# Patient Record
Sex: Male | Born: 1965 | Race: Black or African American | Hispanic: No | Marital: Married | Smoking: Never smoker
Health system: Southern US, Community
[De-identification: ages and names within clinical notes are randomized; demographics above are authoritative.]

## PROBLEM LIST (undated history)

## (undated) DIAGNOSIS — I10 Essential (primary) hypertension: Secondary | ICD-10-CM

## (undated) DIAGNOSIS — E119 Type 2 diabetes mellitus without complications: Secondary | ICD-10-CM

## (undated) DIAGNOSIS — E785 Hyperlipidemia, unspecified: Secondary | ICD-10-CM

## (undated) HISTORY — DX: Type 2 diabetes mellitus without complications: E11.9

## (undated) HISTORY — DX: Hyperlipidemia, unspecified: E78.5

---

## 2002-04-05 ENCOUNTER — Emergency Department (HOSPITAL_COMMUNITY): Admission: EM | Admit: 2002-04-05 | Discharge: 2002-04-05 | Payer: Self-pay | Admitting: Emergency Medicine

## 2008-06-21 ENCOUNTER — Emergency Department (HOSPITAL_COMMUNITY): Admission: EM | Admit: 2008-06-21 | Discharge: 2008-06-21 | Payer: Self-pay | Admitting: Family Medicine

## 2014-03-05 ENCOUNTER — Ambulatory Visit (INDEPENDENT_AMBULATORY_CARE_PROVIDER_SITE_OTHER): Payer: BC Managed Care – PPO | Admitting: General Surgery

## 2014-03-09 ENCOUNTER — Ambulatory Visit (INDEPENDENT_AMBULATORY_CARE_PROVIDER_SITE_OTHER): Payer: BC Managed Care – PPO | Admitting: Surgery

## 2014-03-17 ENCOUNTER — Ambulatory Visit (INDEPENDENT_AMBULATORY_CARE_PROVIDER_SITE_OTHER): Payer: BC Managed Care – PPO | Admitting: General Surgery

## 2014-04-10 ENCOUNTER — Telehealth (INDEPENDENT_AMBULATORY_CARE_PROVIDER_SITE_OTHER): Payer: Self-pay

## 2014-04-10 NOTE — Telephone Encounter (Signed)
LMOM for pt to call me. I want to go over with the pt about his scheduled appt for 7/14 eval.for gynecomastia. Per Dr Dwain SarnaWakefield if the pt has a breast lump we can see the pt to discuss surgery options but if it's truly just gynecomastia the pt would really need to see plastic surgery for that problem. Dr Dwain SarnaWakefield will not do surgery anymore on gynecomastia unless if the pt has a breast lump. I want the pt to be aware of this information before he shows up on Tuesday.

## 2014-04-13 NOTE — Telephone Encounter (Signed)
Pt was returning call. Pt states that he doesn't feel that this is a breast lump, he feels that it is truly gynecomastia. Informed pt that Dr Dwain SarnaWakefield recommendations were to see a plastic surgeon for this. Pt verbalized understanding and we will cancel his appt to see Dr Dwain SarnaWakefield.

## 2014-04-14 ENCOUNTER — Ambulatory Visit (INDEPENDENT_AMBULATORY_CARE_PROVIDER_SITE_OTHER): Payer: BC Managed Care – PPO | Admitting: General Surgery

## 2015-06-06 ENCOUNTER — Ambulatory Visit (INDEPENDENT_AMBULATORY_CARE_PROVIDER_SITE_OTHER): Payer: Commercial Managed Care - HMO

## 2015-06-06 ENCOUNTER — Ambulatory Visit (INDEPENDENT_AMBULATORY_CARE_PROVIDER_SITE_OTHER): Payer: Commercial Managed Care - HMO | Admitting: Family Medicine

## 2015-06-06 VITALS — BP 156/90 | HR 69 | Temp 98.6°F | Resp 18 | Ht 71.0 in | Wt 237.8 lb

## 2015-06-06 DIAGNOSIS — M5432 Sciatica, left side: Secondary | ICD-10-CM | POA: Diagnosis not present

## 2015-06-06 DIAGNOSIS — R030 Elevated blood-pressure reading, without diagnosis of hypertension: Secondary | ICD-10-CM | POA: Diagnosis not present

## 2015-06-06 DIAGNOSIS — IMO0001 Reserved for inherently not codable concepts without codable children: Secondary | ICD-10-CM

## 2015-06-06 DIAGNOSIS — E119 Type 2 diabetes mellitus without complications: Secondary | ICD-10-CM

## 2015-06-06 MED ORDER — KETOROLAC TROMETHAMINE 60 MG/2ML IM SOLN
60.0000 mg | Freq: Once | INTRAMUSCULAR | Status: AC
  Administered 2015-06-06: 60 mg via INTRAMUSCULAR

## 2015-06-06 MED ORDER — HYDROCODONE-ACETAMINOPHEN 5-325 MG PO TABS: 1.0000 | ORAL_TABLET | Freq: Four times a day (QID) | ORAL | Status: DC | PRN

## 2015-06-06 MED ORDER — PREDNISONE 20 MG PO TABS: ORAL_TABLET | ORAL | Status: DC

## 2015-06-06 MED ORDER — METHOCARBAMOL 500 MG PO TABS: 500.0000 mg | ORAL_TABLET | Freq: Four times a day (QID) | ORAL | Status: DC

## 2015-06-06 NOTE — Patient Instructions (Signed)
Sciatica with Rehab The sciatic nerve runs from the back down the leg and is responsible for sensation and control of the muscles in the back (posterior) side of the thigh, lower leg, and foot. Sciatica is a condition that is characterized by inflammation of this nerve.  SYMPTOMS   Signs of nerve damage, including numbness and/or weakness along the posterior side of the lower extremity.  Pain in the back of the thigh that may also travel down the leg.  Pain that worsens when sitting for long periods of time.  Occasionally, pain in the back or buttock. CAUSES  Inflammation of the sciatic nerve is the cause of sciatica. The inflammation is due to something irritating the nerve. Common sources of irritation include:  Sitting for long periods of time.  Direct trauma to the nerve.  Arthritis of the spine.  Herniated or ruptured disk.  Slipping of the vertebrae (spondylolisthesis).  Pressure from soft tissues, such as muscles or ligament-like tissue (fascia). RISK INCREASES WITH:  Sports that place pressure or stress on the spine (football or weightlifting).  Poor strength and flexibility.  Failure to warm up properly before activity.  Family history of low back pain or disk disorders.  Previous back injury or surgery.  Poor body mechanics, especially when lifting, or poor posture. PREVENTION   Warm up and stretch properly before activity.  Maintain physical fitness:  Strength, flexibility, and endurance.  Cardiovascular fitness.  Learn and use proper technique, especially with posture and lifting. When possible, have coach correct improper technique.  Avoid activities that place stress on the spine. PROGNOSIS If treated properly, then sciatica usually resolves within 6 weeks. However, occasionally surgery is necessary.  RELATED COMPLICATIONS   Permanent nerve damage, including pain, numbness, tingle, or weakness.  Chronic back pain.  Risks of surgery: infection,  bleeding, nerve damage, or damage to surrounding tissues. TREATMENT Treatment initially involves resting from any activities that aggravate your symptoms. The use of ice and medication may help reduce pain and inflammation. The use of strengthening and stretching exercises may help reduce pain with activity. These exercises may be performed at home or with referral to a therapist. A therapist may recommend further treatments, such as transcutaneous electronic nerve stimulation (TENS) or ultrasound. Your caregiver may recommend corticosteroid injections to help reduce inflammation of the sciatic nerve. If symptoms persist despite non-surgical (conservative) treatment, then surgery may be recommended. MEDICATION  If pain medication is necessary, then nonsteroidal anti-inflammatory medications, such as aspirin and ibuprofen, or other minor pain relievers, such as acetaminophen, are often recommended.  Do not take pain medication for 7 days before surgery.  Prescription pain relievers may be given if deemed necessary by your caregiver. Use only as directed and only as much as you need.  Ointments applied to the skin may be helpful.  Corticosteroid injections may be given by your caregiver. These injections should be reserved for the most serious cases, because they may only be given a certain number of times. HEAT AND COLD  Cold treatment (icing) relieves pain and reduces inflammation. Cold treatment should be applied for 10 to 15 minutes every 2 to 3 hours for inflammation and pain and immediately after any activity that aggravates your symptoms. Use ice packs or massage the area with a piece of ice (ice massage).  Heat treatment may be used prior to performing the stretching and strengthening activities prescribed by your caregiver, physical therapist, or athletic trainer. Use a heat pack or soak the injury in warm water.   SEEK MEDICAL CARE IF:  Treatment seems to offer no benefit, or the condition  worsens.  Any medications produce adverse side effects. EXERCISES  RANGE OF MOTION (ROM) AND STRETCHING EXERCISES - Sciatica Most people with sciatic will find that their symptoms worsen with either excessive bending forward (flexion) or arching at the low back (extension). The exercises which will help resolve your symptoms will focus on the opposite motion. Your physician, physical therapist or athletic trainer will help you determine which exercises will be most helpful to resolve your low back pain. Do not complete any exercises without first consulting with your clinician. Discontinue any exercises which worsen your symptoms until you speak to your clinician. If you have pain, numbness or tingling which travels down into your buttocks, leg or foot, the goal of the therapy is for these symptoms to move closer to your back and eventually resolve. Occasionally, these leg symptoms will get better, but your low back pain may worsen; this is typically an indication of progress in your rehabilitation. Be certain to be very alert to any changes in your symptoms and the activities in which you participated in the 24 hours prior to the change. Sharing this information with your clinician will allow him/her to most efficiently treat your condition. These exercises may help you when beginning to rehabilitate your injury. Your symptoms may resolve with or without further involvement from your physician, physical therapist or athletic trainer. While completing these exercises, remember:   Restoring tissue flexibility helps normal motion to return to the joints. This allows healthier, less painful movement and activity.  An effective stretch should be held for at least 30 seconds.  A stretch should never be painful. You should only feel a gentle lengthening or release in the stretched tissue. FLEXION RANGE OF MOTION AND STRETCHING EXERCISES: STRETCH - Flexion, Single Knee to Chest   Lie on a firm bed or floor  with both legs extended in front of you.  Keeping one leg in contact with the floor, bring your opposite knee to your chest. Hold your leg in place by either grabbing behind your thigh or at your knee.  Pull until you feel a gentle stretch in your low back. Hold __________ seconds.  Slowly release your grasp and repeat the exercise with the opposite side. Repeat __________ times. Complete this exercise __________ times per day.  STRETCH - Flexion, Double Knee to Chest  Lie on a firm bed or floor with both legs extended in front of you.  Keeping one leg in contact with the floor, bring your opposite knee to your chest.  Tense your stomach muscles to support your back and then lift your other knee to your chest. Hold your legs in place by either grabbing behind your thighs or at your knees.  Pull both knees toward your chest until you feel a gentle stretch in your low back. Hold __________ seconds.  Tense your stomach muscles and slowly return one leg at a time to the floor. Repeat __________ times. Complete this exercise __________ times per day.  STRETCH - Low Trunk Rotation   Lie on a firm bed or floor. Keeping your legs in front of you, bend your knees so they are both pointed toward the ceiling and your feet are flat on the floor.  Extend your arms out to the side. This will stabilize your upper body by keeping your shoulders in contact with the floor.  Gently and slowly drop both knees together to one side until   you feel a gentle stretch in your low back. Hold for __________ seconds.  Tense your stomach muscles to support your low back as you bring your knees back to the starting position. Repeat the exercise to the other side. Repeat __________ times. Complete this exercise __________ times per day  EXTENSION RANGE OF MOTION AND FLEXIBILITY EXERCISES: STRETCH - Extension, Prone on Elbows  Lie on your stomach on the floor, a bed will be too soft. Place your palms about shoulder  width apart and at the height of your head.  Place your elbows under your shoulders. If this is too painful, stack pillows under your chest.  Allow your body to relax so that your hips drop lower and make contact more completely with the floor.  Hold this position for __________ seconds.  Slowly return to lying flat on the floor. Repeat __________ times. Complete this exercise __________ times per day.  RANGE OF MOTION - Extension, Prone Press Ups  Lie on your stomach on the floor, a bed will be too soft. Place your palms about shoulder width apart and at the height of your head.  Keeping your back as relaxed as possible, slowly straighten your elbows while keeping your hips on the floor. You may adjust the placement of your hands to maximize your comfort. As you gain motion, your hands will come more underneath your shoulders.  Hold this position __________ seconds.  Slowly return to lying flat on the floor. Repeat __________ times. Complete this exercise __________ times per day.  STRENGTHENING EXERCISES - Sciatica  These exercises may help you when beginning to rehabilitate your injury. These exercises should be done near your "sweet spot." This is the neutral, low-back arch, somewhere between fully rounded and fully arched, that is your least painful position. When performed in this safe range of motion, these exercises can be used for people who have either a flexion or extension based injury. These exercises may resolve your symptoms with or without further involvement from your physician, physical therapist or athletic trainer. While completing these exercises, remember:   Muscles can gain both the endurance and the strength needed for everyday activities through controlled exercises.  Complete these exercises as instructed by your physician, physical therapist or athletic trainer. Progress with the resistance and repetition exercises only as your caregiver advises.  You may  experience muscle soreness or fatigue, but the pain or discomfort you are trying to eliminate should never worsen during these exercises. If this pain does worsen, stop and make certain you are following the directions exactly. If the pain is still present after adjustments, discontinue the exercise until you can discuss the trouble with your clinician. STRENGTHENING - Deep Abdominals, Pelvic Tilt   Lie on a firm bed or floor. Keeping your legs in front of you, bend your knees so they are both pointed toward the ceiling and your feet are flat on the floor.  Tense your lower abdominal muscles to press your low back into the floor. This motion will rotate your pelvis so that your tail bone is scooping upwards rather than pointing at your feet or into the floor.  With a gentle tension and even breathing, hold this position for __________ seconds. Repeat __________ times. Complete this exercise __________ times per day.  STRENGTHENING - Abdominals, Crunches   Lie on a firm bed or floor. Keeping your legs in front of you, bend your knees so they are both pointed toward the ceiling and your feet are flat on the   floor. Cross your arms over your chest.  Slightly tip your chin down without bending your neck.  Tense your abdominals and slowly lift your trunk high enough to just clear your shoulder blades. Lifting higher can put excessive stress on the low back and does not further strengthen your abdominal muscles.  Control your return to the starting position. Repeat __________ times. Complete this exercise __________ times per day.  STRENGTHENING - Quadruped, Opposite UE/LE Lift  Assume a hands and knees position on a firm surface. Keep your hands under your shoulders and your knees under your hips. You may place padding under your knees for comfort.  Find your neutral spine and gently tense your abdominal muscles so that you can maintain this position. Your shoulders and hips should form a rectangle  that is parallel with the floor and is not twisted.  Keeping your trunk steady, lift your right hand no higher than your shoulder and then your left leg no higher than your hip. Make sure you are not holding your breath. Hold this position __________ seconds.  Continuing to keep your abdominal muscles tense and your back steady, slowly return to your starting position. Repeat with the opposite arm and leg. Repeat __________ times. Complete this exercise __________ times per day.  STRENGTHENING - Abdominals and Quadriceps, Straight Leg Raise   Lie on a firm bed or floor with both legs extended in front of you.  Keeping one leg in contact with the floor, bend the other knee so that your foot can rest flat on the floor.  Find your neutral spine, and tense your abdominal muscles to maintain your spinal position throughout the exercise.  Slowly lift your straight leg off the floor about 6 inches for a count of 15, making sure to not hold your breath.  Still keeping your neutral spine, slowly lower your leg all the way to the floor. Repeat this exercise with each leg __________ times. Complete this exercise __________ times per day. POSTURE AND BODY MECHANICS CONSIDERATIONS - Sciatica Keeping correct posture when sitting, standing or completing your activities will reduce the stress put on different body tissues, allowing injured tissues a chance to heal and limiting painful experiences. The following are general guidelines for improved posture. Your physician or physical therapist will provide you with any instructions specific to your needs. While reading these guidelines, remember:  The exercises prescribed by your provider will help you have the flexibility and strength to maintain correct postures.  The correct posture provides the optimal environment for your joints to work. All of your joints have less wear and tear when properly supported by a spine with good posture. This means you will  experience a healthier, less painful body.  Correct posture must be practiced with all of your activities, especially prolonged sitting and standing. Correct posture is as important when doing repetitive low-stress activities (typing) as it is when doing a single heavy-load activity (lifting). RESTING POSITIONS Consider which positions are most painful for you when choosing a resting position. If you have pain with flexion-based activities (sitting, bending, stooping, squatting), choose a position that allows you to rest in a less flexed posture. You would want to avoid curling into a fetal position on your side. If your pain worsens with extension-based activities (prolonged standing, working overhead), avoid resting in an extended position such as sleeping on your stomach. Most people will find more comfort when they rest with their spine in a more neutral position, neither too rounded nor too   arched. Lying on a non-sagging bed on your side with a pillow between your knees, or on your back with a pillow under your knees will often provide some relief. Keep in mind, being in any one position for a prolonged period of time, no matter how correct your posture, can still lead to stiffness. PROPER SITTING POSTURE In order to minimize stress and discomfort on your spine, you must sit with correct posture Sitting with good posture should be effortless for a healthy body. Returning to good posture is a gradual process. Many people can work toward this most comfortably by using various supports until they have the flexibility and strength to maintain this posture on their own. When sitting with proper posture, your ears will fall over your shoulders and your shoulders will fall over your hips. You should use the back of the chair to support your upper back. Your low back will be in a neutral position, just slightly arched. You may place a small pillow or folded towel at the base of your low back for support.  When  working at a desk, create an environment that supports good, upright posture. Without extra support, muscles fatigue and lead to excessive strain on joints and other tissues. Keep these recommendations in mind: CHAIR:   A chair should be able to slide under your desk when your back makes contact with the back of the chair. This allows you to work closely.  The chair's height should allow your eyes to be level with the upper part of your monitor and your hands to be slightly lower than your elbows. BODY POSITION  Your feet should make contact with the floor. If this is not possible, use a foot rest.  Keep your ears over your shoulders. This will reduce stress on your neck and low back. INCORRECT SITTING POSTURES   If you are feeling tired and unable to assume a healthy sitting posture, do not slouch or slump. This puts excessive strain on your back tissues, causing more damage and pain. Healthier options include:  Using more support, like a lumbar pillow.  Switching tasks to something that requires you to be upright or walking.  Talking a brief walk.  Lying down to rest in a neutral-spine position. PROLONGED STANDING WHILE SLIGHTLY LEANING FORWARD  When completing a task that requires you to lean forward while standing in one place for a long time, place either foot up on a stationary 2-4 inch high object to help maintain the best posture. When both feet are on the ground, the low back tends to lose its slight inward curve. If this curve flattens (or becomes too large), then the back and your other joints will experience too much stress, fatigue more quickly and can cause pain.  CORRECT STANDING POSTURES Proper standing posture should be assumed with all daily activities, even if they only take a few moments, like when brushing your teeth. As in sitting, your ears should fall over your shoulders and your shoulders should fall over your hips. You should keep a slight tension in your abdominal  muscles to brace your spine. Your tailbone should point down to the ground, not behind your body, resulting in an over-extended swayback posture.  INCORRECT STANDING POSTURES  Common incorrect standing postures include a forward head, locked knees and/or an excessive swayback. WALKING Walk with an upright posture. Your ears, shoulders and hips should all line-up. PROLONGED ACTIVITY IN A FLEXED POSITION When completing a task that requires you to bend forward   at your waist or lean over a low surface, try to find a way to stabilize 3 of 4 of your limbs. You can place a hand or elbow on your thigh or rest a knee on the surface you are reaching across. This will provide you more stability so that your muscles do not fatigue as quickly. By keeping your knees relaxed, or slightly bent, you will also reduce stress across your low back. CORRECT LIFTING TECHNIQUES DO :   Assume a wide stance. This will provide you more stability and the opportunity to get as close as possible to the object which you are lifting.  Tense your abdominals to brace your spine; then bend at the knees and hips. Keeping your back locked in a neutral-spine position, lift using your leg muscles. Lift with your legs, keeping your back straight.  Test the weight of unknown objects before attempting to lift them.  Try to keep your elbows locked down at your sides in order get the best strength from your shoulders when carrying an object.  Always ask for help when lifting heavy or awkward objects. INCORRECT LIFTING TECHNIQUES DO NOT:   Lock your knees when lifting, even if it is a small object.  Bend and twist. Pivot at your feet or move your feet when needing to change directions.  Assume that you cannot safely pick up a paperclip without proper posture. Document Released: 09/18/2005 Document Revised: 02/02/2014 Document Reviewed: 12/31/2008 [employer] Patient Information 2015 [employer]. This information is not intended to  replace advice given to you by your health care provider. Make sure you discuss any questions you have with your health care provider.  

## 2015-06-06 NOTE — Progress Notes (Addendum)
Subjective:  This chart was scribed for Clayborn Milnes, MD by [facility], medical scribe at [facility].The patient was seen in exam room 03 and the patient's care was started at 12:07 PM.   Patient ID: Jeremy Mckay, male    DOB: [birth year], 49 y.o.   MRN: [id number]  06/06/2015  Leg Pain and Hip Pain  HPI HPI Comments: Jeremy Mckay is a 49 y.o. male who presents to [facility] complaining of left hip pain radiating down the back of his leg, onset three days ago. Described as a burning pain rated 8/10. Numbness and tingling in his left foot. Pain is worse with ambulating. Also complains of minor back pain. Similar pain one month ago, had two injections through the city of [place]. This did ease up the pain. Taken aleve for relief. Yesterday two every 4 hours. Also tried epsom salt, and bengay cream.  He denies urine, bowel incontinence, no saddle anesthesia. Job requires lifting yard waste, no known injures. Hx of diabetes. Unsure what his blood sugar is running. He does not own a glucometer. A1C around 7 two months ago.  PCP: Angelita Ingles.   Review of Systems  Constitutional: Negative for fever, chills, diaphoresis and fatigue.  Endocrine: Negative for polydipsia, polyphagia and polyuria.  Genitourinary: Negative for decreased urine volume and difficulty urinating.  Musculoskeletal: Positive for myalgias, back pain and gait problem. Negative for neck pain and neck stiffness.  Neurological: Positive for weakness and numbness.    Past Medical History  Diagnosis Date  . Diabetes mellitus without complication   . Hyperlipidemia    History reviewed. No pertinent past surgical history. Allergies  Allergen Reactions  . Amoxicillin   . Penicillins        Objective:    BP 156/90 mmHg  Pulse 69  Temp(Src) 98.6 F (37 C) (Oral)  Resp 18  Ht  (1.803 m)  Wt 237 lb 12.8 oz (107.865 kg)  BMI 33.18 kg/m2  SpO2 99% Physical Exam    Constitutional: He is oriented to person, place, and time. He appears well-developed and well-nourished. No distress.  HENT:  Head: Normocephalic and atraumatic.  Eyes: Conjunctivae and EOM are normal. Pupils are equal, round, and reactive to light.  Neck: Normal range of motion. Neck supple. Carotid bruit is not present. No thyromegaly present.  Cardiovascular: Normal rate, regular rhythm, normal heart sounds and intact distal pulses.  Exam reveals no gallop and no friction rub.   No murmur heard. Pulmonary/Chest: Effort normal and breath sounds normal. He has no wheezes. He has no rales.  Abdominal: Soft. Bowel sounds are normal.  Musculoskeletal:       Lumbar back: He exhibits pain. He exhibits no tenderness, no bony tenderness, no spasm and normal pulse.  Straight leg raise positive on the left. Toe and heel walking were normal. 5/5 motor strength. Sensation was intact. Pain with ROM of the lumbar spine in all directions.  Neurological: He is alert and oriented to person, place, and time. No cranial nerve deficit.  Skin: Skin is warm and dry. No rash noted. He is not diaphoretic.  Psychiatric: He has a normal mood and affect. His behavior is normal.  Nursing note and vitals reviewed.  No results found for this or any previous visit.    [facility] reading (PRIMARY) by  Dr. Katrinka Blazing.  LUMBAR Nilda SimmerMS: NAD.  TORADOL  IM ADMINISTERED.   Assessment & Plan:   1. Left  sided sciatica   2. Type 2 diabetes mellitus without complication   3. Elevated BP     1.  L sciatica:  New.  S/p Toradol 60mg  IM in office; rx for Prednisone, Robaxin, and Hydrocodone provided.  Home exercise program provided.  If no improvement in 7-10 days, call for ortho referral. 2.  DMII: well controlled; recent HgbA1c of 7.0; Prednisone can elevate sugars. 3.  Elevated blood pressure: New.  Pain likely contributing; follow-up with Dr. Providence Lanius for recheck.   Meds ordered this encounter  Medications  .  metFORMIN (GLUCOPHAGE) 500 MG tablet    Sig: Take by mouth daily.  Marland Kitchen atorvastatin (LIPITOR) 20 MG tablet    Sig: Take 20 mg by mouth daily.  . Cholecalciferol (VITAMIN D3) 1000 UNITS CAPS    Sig: Take by mouth.  Marland Kitchen ketorolac (TORADOL) injection 60 mg    Sig:   . predniSONE (DELTASONE) 20 MG tablet    Sig: Three tablets daily x 2 days then two tablets daily x 5 days then one tablet daily for five days    Dispense:  21 tablet    Refill:  0  . methocarbamol (ROBAXIN) 500 MG tablet    Sig: Take 1-2 tablets (500-1,000 mg total) by mouth 4 (four) times daily.    Dispense:  60 tablet    Refill:  0  . HYDROcodone-acetaminophen (NORCO/VICODIN) 5-325 MG per tablet    Sig: Take 1 tablet by mouth every 6 (six) hours as needed for moderate pain.    Dispense:  40 tablet    Refill:  0   No Follow-up on file.  I personally performed the services described in this documentation, which was scribed in my presence. The recorded information has been reviewed and considered.  Nikia Levels Paulita Fujita, M.D. [facility] [address] [place], [place]  [address] [phone] phone [phone] fax

## 2015-06-13 ENCOUNTER — Ambulatory Visit (INDEPENDENT_AMBULATORY_CARE_PROVIDER_SITE_OTHER): Payer: Commercial Managed Care - HMO | Admitting: Physician Assistant

## 2015-06-13 VITALS — BP 144/90 | HR 84 | Temp 98.6°F | Resp 18 | Wt 235.0 lb

## 2015-06-13 DIAGNOSIS — M5432 Sciatica, left side: Secondary | ICD-10-CM | POA: Diagnosis not present

## 2015-06-13 MED ORDER — METHOCARBAMOL 500 MG PO TABS
500.0000 mg | ORAL_TABLET | Freq: Four times a day (QID) | ORAL | Status: DC
Start: 2015-06-13 — End: 2018-01-03

## 2015-06-13 MED ORDER — HYDROCODONE-ACETAMINOPHEN 5-325 MG PO TABS: 1.0000 | ORAL_TABLET | Freq: Four times a day (QID) | ORAL | Status: DC | PRN

## 2015-06-13 MED ORDER — MELOXICAM 15 MG PO TABS: 15.0000 mg | ORAL_TABLET | Freq: Every day | ORAL | Status: DC

## 2015-06-13 NOTE — Patient Instructions (Signed)
Sciatica Sciatica is pain, weakness, numbness, or tingling along the path of the sciatic nerve. The nerve starts in the lower back and runs down the back of each leg. The nerve controls the muscles in the lower leg and in the back of the knee, while also providing sensation to the back of the thigh, lower leg, and the sole of your foot. Sciatica is a symptom of another medical condition. For instance, nerve damage or certain conditions, such as a herniated disk or bone spur on the spine, pinch or put pressure on the sciatic nerve. This causes the pain, weakness, or other sensations normally associated with sciatica. Generally, sciatica only affects one side of the body. CAUSES   Herniated or slipped disc.  Degenerative disk disease.  A pain disorder involving the narrow muscle in the buttocks (piriformis syndrome).  Pelvic injury or fracture.  Pregnancy.  Tumor (rare). SYMPTOMS  Symptoms can vary from mild to very severe. The symptoms usually travel from the low back to the buttocks and down the back of the leg. Symptoms can include:  Mild tingling or dull aches in the lower back, leg, or hip.  Numbness in the back of the calf or sole of the foot.  Burning sensations in the lower back, leg, or hip.  Sharp pains in the lower back, leg, or hip.  Leg weakness.  Severe back pain inhibiting movement. These symptoms may get worse with coughing, sneezing, laughing, or prolonged sitting or standing. Also, being overweight may worsen symptoms. DIAGNOSIS  Your caregiver will perform a physical exam to look for common symptoms of sciatica. He or she may ask you to do certain movements or activities that would trigger sciatic nerve pain. Other tests may be performed to find the cause of the sciatica. These may include:  Blood tests.  X-rays.  Imaging tests, such as an MRI or CT scan. TREATMENT  Treatment is directed at the cause of the sciatic pain. Sometimes, treatment is not necessary  and the pain and discomfort goes away on its own. If treatment is needed, your caregiver may suggest:  Over-the-counter medicines to relieve pain.  Prescription medicines, such as anti-inflammatory medicine, muscle relaxants, or narcotics.  Applying heat or ice to the painful area.  Steroid injections to lessen pain, irritation, and inflammation around the nerve.  Reducing activity during periods of pain.  Exercising and stretching to strengthen your abdomen and improve flexibility of your spine. Your caregiver may suggest losing weight if the extra weight makes the back pain worse.  Physical therapy.  Surgery to eliminate what is pressing or pinching the nerve, such as a bone spur or part of a herniated disk. HOME CARE INSTRUCTIONS   Only take over-the-counter or prescription medicines for pain or discomfort as directed by your caregiver.  Apply ice to the affected area for 20 minutes, 3-4 times a day for the first 48-72 hours. Then try heat in the same way.  Exercise, stretch, or perform your usual activities if these do not aggravate your pain.  Attend physical therapy sessions as directed by your caregiver.  Keep all follow-up appointments as directed by your caregiver.  Do not wear high heels or shoes that do not provide proper support.  Check your mattress to see if it is too soft. A firm mattress may lessen your pain and discomfort. SEEK IMMEDIATE MEDICAL CARE IF:   You lose control of your bowel or bladder (incontinence).  You have increasing weakness in the lower back, pelvis, buttocks,   or legs.  You have redness or swelling of your back.  You have a burning sensation when you urinate.  You have pain that gets worse when you lie down or awakens you at night.  Your pain is worse than you have experienced in the past.  Your pain is lasting longer than 4 weeks.  You are suddenly losing weight without reason. MAKE SURE YOU:  Understand these  instructions.  Will watch your condition.  Will get help right away if you are not doing well or get worse. Document Released: 09/12/2001 Document Revised: 03/19/2012 Document Reviewed: 01/28/2012 Exit[employer] Patient Information 2015 [employer]. This information is not intended to replace advice given to you by your health care provider. Make sure you discuss any questions you have with your health care provider.  

## 2015-06-13 NOTE — Progress Notes (Signed)
   Subjective:    Patient ID: Jeremy Mckay, male    DOB: 05-May-1966, 49 y.o.   MRN: [id number]  HPI Patient presents for left-sided sciatic pain that has been present for a and a half weeks. Was seen in clinic 1 week ago and says that he has only gotten a little better. Was having 8/10 burning pain going down the left leg along with numbness and tingling of the left foot. Today he says that his pain is 5-6/10 and mostly in the calf, but felt partially in the back of the left thigh. Numbness and tingling of foot have also improved. Has been doing exercises daily and using and heating pad afterwards, but feel like whole leg gets tight afterwards. Has been taking robaxin and norco scheduled as well as prednisone daily. Has sporadic lower back pain, but that is tolerable in comparison to leg. Describes gait as limping and has to take breaks secondary to pain. Picks up yard waste for work.  Allergies  Allergen Reactions  . Amoxicillin   . Penicillins    Review of Systems As noted above. Rest of ROS negative.    Objective:   Physical Exam  Constitutional: He is oriented to person, place, and time. He appears well-developed and well-nourished. No distress.  Blood pressure 144/90, pulse 84, temperature 98.6 F (37 C), temperature source Oral, resp. rate 18, weight 235 lb (106.595 kg), SpO2 98 %.  HENT:  Head: Normocephalic and atraumatic.  Right Ear: External ear normal.  Left Ear: External ear normal.  Eyes: Conjunctivae are normal. Right eye exhibits no discharge. Left eye exhibits no discharge. No scleral icterus.  Pulmonary/Chest: Effort normal.  Musculoskeletal:       Left upper leg: He exhibits no tenderness, no bony tenderness, no swelling, no edema, no deformity and no laceration.       Left lower leg: He exhibits no tenderness, no bony tenderness, no swelling and no edema.       Left foot: Normal.  Left leg: positive straight leg test  Neurological: He is alert and oriented to  person, place, and time. He has normal strength. He displays no atrophy. No cranial nerve deficit or sensory deficit. He exhibits normal muscle tone.  Skin: He is not diaphoretic.  Psychiatric: He has a normal mood and affect. His behavior is normal. Judgment and thought content normal.       Assessment & Plan:  1. Left sided sciatica Continue exercises and using heat following.  - HYDROcodone-acetaminophen (NORCO/VICODIN) 5-325 MG per tablet; Take 1 tablet by mouth every 6 (six) hours as needed for moderate pain.  Dispense: 40 tablet; Refill: 0 - methocarbamol (ROBAXIN) 500 MG tablet; Take 1-2 tablets (500-1,000 mg total) by mouth 4 (four) times daily.  Dispense: 60 tablet; Refill: 0 - meloxicam (MOBIC) 15 MG tablet; Take 1 tablet (15 mg total) by mouth daily.  Dispense: 30 tablet; Refill: 1 - Ambulatory referral to Orthopedic Surgery   Janan Ridge PA-C  [facility] 06/13/2015 9:54 AM

## 2016-01-06 ENCOUNTER — Other Ambulatory Visit: Payer: Self-pay | Admitting: Nurse Practitioner

## 2016-01-06 ENCOUNTER — Ambulatory Visit
Admission: RE | Admit: 2016-01-06 | Discharge: 2016-01-06 | Disposition: A | Discharge status: Home / Self Care | Payer: Worker's Compensation | Source: Ambulatory Visit | Attending: Nurse Practitioner | Admitting: Nurse Practitioner

## 2016-01-06 DIAGNOSIS — M25562 Pain in left knee: Secondary | ICD-10-CM

## 2016-01-06 DIAGNOSIS — R609 Edema, unspecified: Secondary | ICD-10-CM

## 2016-01-21 ENCOUNTER — Ambulatory Visit (INDEPENDENT_AMBULATORY_CARE_PROVIDER_SITE_OTHER): Payer: Commercial Managed Care - HMO | Admitting: Physician Assistant

## 2016-01-21 VITALS — BP 118/77 | HR 67 | Temp 98.6°F | Resp 16 | Ht 71.0 in | Wt 235.0 lb

## 2016-01-21 DIAGNOSIS — S8412XA Injury of peroneal nerve at lower leg level, left leg, initial encounter: Secondary | ICD-10-CM | POA: Diagnosis not present

## 2016-01-21 NOTE — Progress Notes (Signed)
01/21/2016 10:08 AM   DOB: Jun 19, 1966 / MRN: [id number]  SUBJECTIVE:  Jeremy Mckay is a 50 y.o. male presenting for numbness and weakness about the lateral left leg. The numbess and weakness started 48 hours ago and is not getting better or worse.   Had a knee injury a few weeks back and has been wearing a brace, reports that he stopped wearing the brace Wednesday because he noticed the numbness.   He reports having foot drop and can't even put his flip flops on due to weakness in dorsi flexion. He denies any other symptoms today and overall feels well.  He is a controlled diabetic.    He is allergic to amoxicillin and penicillins.   He  has a past medical history of Diabetes mellitus without complication (HCC) and Hyperlipidemia.    He  reports that he has never smoked. He does not have any smokeless tobacco history on file. He  has no sexual activity history on file. The patient  has no past surgical history on file.  His family history includes Cancer in his father and mother; Diabetes in his mother.  Review of Systems  Constitutional: Negative for fever and chills.  Eyes: Negative for blurred vision.  Respiratory: Negative for cough and shortness of breath.   Cardiovascular: Negative for chest pain.  Gastrointestinal: Negative for nausea and abdominal pain.  Genitourinary: Negative for dysuria, urgency and frequency.  Musculoskeletal: Negative for myalgias.  Skin: Negative for rash.  Neurological: Positive for tingling and focal weakness. Negative for dizziness, tremors, speech change and headaches.  Psychiatric/Behavioral: Negative for depression. The patient is not nervous/anxious.     Problem list and medications reviewed and updated by myself where necessary, and exist elsewhere in the encounter.   OBJECTIVE:  BP 118/77 mmHg  Pulse 67  Temp(Src) 98.6 F (37 C) (Oral)  Resp 16  Ht  (1.803 m)  Wt 235 lb (106.595 kg)  BMI 32.79 kg/m2  SpO2 97%  Physical Exam    Constitutional: He is oriented to person, place, and time. He appears well-developed. He does not appear ill.  Eyes: Conjunctivae and EOM are normal. Pupils are equal, round, and reactive to light.  Cardiovascular: Normal rate.   Pulmonary/Chest: Effort normal.  Abdominal: He exhibits no distension.  Musculoskeletal: Normal range of motion.  Neurological: He is alert and oriented to person, place, and time. No cranial nerve deficit or sensory deficit. Coordination normal.  Numbness about the common peroneal distribution.  Weakness with dorsi flexion and ankle eversion.  Left sided foot drop apparent with ambulation.   Skin: Skin is warm and dry. He is not diaphoretic.  Psychiatric: He has a normal mood and affect.  Nursing note and vitals reviewed.     No results found for this or any previous visit (from the past 72 hour(s)).  No results found.  ASSESSMENT AND PLAN  [place]on was seen today for numbness.  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  Aside from his peroneal symptoms he is neurologically intact.  I do not suspect any other etiology at play. Will get him to ortho today given that this problem started within the last 48 hours there may be something that can be done to prevent a neurological deficit.   -     Ambulatory referral to Orthopedic Surgery   The patient was advised to call or return  to clinic if he does not see an improvement in symptoms or to seek the care of the closest emergency department if he worsens with the above plan.   Deliah BostonMichael Clark, MHS, PA-C [facility] 01/21/2016 10:08 AM

## 2016-01-21 NOTE — Patient Instructions (Addendum)
Please arrive at [facility] at 1:30 pm for your 2 pm appt. [address]  [phone]    IF you received an x-ray today, you will receive an invoice from [facility]. Please contact [facility] at [phone] with questions or concerns regarding your invoice.   IF you received labwork today, you will receive an invoice from [facility]. Please contact [facility] at [phone] with questions or concerns regarding your invoice.   Our billing staff will not be able to assist you with questions regarding bills from these companies.  You will be contacted with the lab results as soon as they are available. The fastest way to get your results is to activate your My Chart account. Instructions are located on the last page of this paperwork. If you have not heard from us regarding the results in 2 weeks, please contact this office.

## 2016-04-29 ENCOUNTER — Ambulatory Visit (INDEPENDENT_AMBULATORY_CARE_PROVIDER_SITE_OTHER): Payer: Self-pay | Admitting: Family Medicine

## 2016-04-29 VITALS — BP 140/90 | HR 60 | Temp 98.4°F | Resp 18 | Ht 71.0 in | Wt 240.0 lb

## 2016-04-29 DIAGNOSIS — Z021 Encounter for pre-employment examination: Secondary | ICD-10-CM

## 2016-04-29 DIAGNOSIS — Z024 Encounter for examination for driving license: Secondary | ICD-10-CM

## 2016-04-29 NOTE — Progress Notes (Signed)
Subjective:  By signing my name below, I, Stann Ore, attest that this documentation has been prepared under the direction and in the presence of Meredith Staggers, MD. Electronically Signed: Stann Ore, Scribe. 04/29/2016 , 11:27 AM .  Patient was seen in Room 13 .   Patient ID: Jeremy Mckay, male    DOB: 11-Apr-1966, 50 y.o.   MRN: [id number] Chief Complaint  Patient presents with  . Employment Physical    DOT   HPI Jeremy Mckay is a 50 y.o. male Here for DOT physical. History of DM and HLD. He was seen in April for leg numbness and weakness after a back injury few weeks prior. He was noted to have left sided foot drop and referred to orthopedics. 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   DM He's on metformin 500mg  qd. He's currently not on insulin. He's followed by Dr. Margaretmary Bayley, last seen about 2 months ago. He was informed diabetes is controlled, and doesn't recall his A1c. He denies any symptomatic lows. His last sugar was 135 this week.   He notes some snoring at night. He denies daytime somnolence.   HLD He takes lipitor 20mg  qd. He misses his medication occasionally, about a couple days a week. He denies chest pain or shortness of breath with exertion. He denies weakness or myalgia.   Vision He wears corrective glasses. He denies being informed of having glaucoma or pressure.   Knee Pain He saw orthopedics for his knee pain; was informed he had a pinched nerve in his back. He still has occasional tingling and slight weakness in his left leg but this has greatly improved with more strength. He denies taking medication for it. 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    There are no active problems to display for this patient.  Past Medical History:  Diagnosis Date  . Diabetes mellitus without complication (HCC)   . Hyperlipidemia    No past surgical history on file. Allergies  Allergen Reactions  .  Amoxicillin   . Penicillins    Prior to Admission medications   Medication Sig Start Date End Date Taking? Authorizing Provider  amLODipine-valsartan (EXFORGE) 5-160 MG tablet Take 1 tablet by mouth daily.   Yes Historical Provider, MD  atorvastatin (LIPITOR) 20 MG tablet Take 20 mg by mouth daily.   Yes Historical Provider, MD  Cholecalciferol (VITAMIN D3) 1000 UNITS CAPS Take by mouth.   Yes Historical Provider, MD  doxycycline (PERIOSTAT) 20 MG tablet Take 20 mg by mouth 2 (two) times daily.   Yes Historical Provider, MD  metFORMIN (GLUCOPHAGE) 500 MG tablet Take by mouth daily.   Yes Historical Provider, MD  omeprazole (PRILOSEC) 20 MG capsule Take 20 mg by mouth daily.   Yes Historical Provider, MD  HYDROcodone-acetaminophen (NORCO/VICODIN) 5-325 MG per tablet Take 1 tablet by mouth every 6 (six) hours as needed for moderate pain. Patient not taking: Reported on 01/21/2016 06/13/15   Tishira R Brewington, PA-C  meloxicam (MOBIC) 15 MG tablet Take 1 tablet (15 mg total) by mouth daily. Patient not taking: Reported on 01/21/2016 06/13/15   Tishira R Brewington, PA-C  methocarbamol (ROBAXIN) 500 MG tablet Take 1-2 tablets (500-1,000 mg total) by mouth 4 (four) times daily. Patient not taking: Reported on 01/21/2016 06/13/15   Tishira R Brewington, PA-C  predniSONE (DELTASONE) 20 MG tablet Three tablets daily x 2 days then two tablets daily x 5 days then one  tablet daily for five days Patient not taking: Reported on 01/21/2016 06/06/15   Ethelda Chick, MD   Social History   Social History  . Marital status: Married    Spouse name: N/A  . Number of children: N/A  . Years of education: N/A   Occupational History  . Not on file.   Social History Main Topics  . Smoking status: Never Smoker  . Smokeless tobacco: Not on file  . Alcohol use Not on file  . Drug use: Unknown  . Sexual activity: Not on file   Other Topics Concern  . Not on file   Social History Narrative  . No narrative on  file   Review of Systems  Constitutional: Negative for chills, fatigue and fever.  Respiratory: Negative for shortness of breath and wheezing.   Cardiovascular: Negative for chest pain and leg swelling.  Gastrointestinal: Negative for diarrhea, nausea and vomiting.  Musculoskeletal: Positive for arthralgias. Negative for myalgias.  Neurological: Negative for weakness.       Objective:   Physical Exam  Constitutional: He is oriented to person, place, and time. He appears well-developed and well-nourished.  HENT:  Head: Normocephalic and atraumatic.  Right Ear: External ear normal.  Left Ear: External ear normal.  Mouth/Throat: Oropharynx is clear and moist.  Eyes: Conjunctivae and EOM are normal. Pupils are equal, round, and reactive to light.  Neck: Normal range of motion. Neck supple. No thyromegaly present.  Cardiovascular: Normal rate, regular rhythm, normal heart sounds and intact distal pulses.   Pulmonary/Chest: Effort normal and breath sounds normal. No respiratory distress. He has no wheezes.  Abdominal: Soft. He exhibits no distension. There is no tenderness. Hernia confirmed negative in the right inguinal area and confirmed negative in the left inguinal area.  Musculoskeletal: Normal range of motion. He exhibits no edema or tenderness.  Able to toe walk, slight decreased heel walk on left but still able to heel walk; does have strength with dorsi and plantar flexion with resisted flexion and extension; equal ROM of L-spine  Lymphadenopathy:    He has no cervical adenopathy.  Neurological: He is alert and oriented to person, place, and time. He has normal reflexes.  Skin: Skin is warm and dry.  Psychiatric: He has a normal mood and affect. His behavior is normal.  Vitals reviewed.   Vitals:   04/29/16 1009  BP: 140/90  Pulse: 60  Resp: 18  Temp: 98.4 F (36.9 C)  TempSrc: Oral  SpO2: 100%  Weight: 240 lb (108.9 kg)  Height: 5\' 11"  (1.803 m)    Visual Acuity  Screening   Right eye Left eye Both eyes  Without correction:     With correction: 20 15 20 15 20 15   Comments: Peripheral Vision: Right eye 85 degrees. Left eye 85 degrees.  The patient can distinguish the colors red, amber and green.  Hearing Screening Comments: The patient was able to hear a forced whisper from 10 feet.     Assessment & Plan:    Jeremy Mckay is a 50 y.o. male Encounter for commercial driver medical examination (CDME)  - History of diabetes, hypertension. Both controlled. Prior left leg dysesthesias, sciatic symptoms, but improving, and exhibits sufficient strength in lower extremity and the foot to operate vehicle. No apparent restrictions needed at this time regarding musculoskeletal issues. Did advise if any increased weakness or change in use of leg, should abstain from driving until further evaluation. One year card given, see paperwork.  No  orders of the defined types were placed in this encounter.  Patient Instructions    One year card given for DOT with your history of high blood pressure and diabetes. Bring a copy of your most recent hemoglobin A1c to your next due to physical. Continue routine follow-up with your primary care provider, and you can recheck a urine test at your primary care doctor's office as there was trace protein noted on your urine testing today. If any increased weakness or loss of feeling in the leg or foot, be seen by your specialist immediately, and do not drive if you're experiencing these symptoms.   IF you received an x-ray today, you will receive an invoice from [facility]. Please contact [facility] at [phone] with questions or concerns regarding your invoice.   IF you received labwork today, you will receive an invoice from [employer]. Please contact [facility] at [phone] with questions or concerns regarding your invoice.   Our billing staff will not be able to assist you  with questions regarding bills from these companies.  You will be contacted with the lab results as soon as they are available. The fastest way to get your results is to activate your My Chart account. Instructions are located on the last page of this paperwork. If you have not heard from [place] regarding the results in 2 weeks, please contact this office.    I personally performed the services described in this documentation, which was scribed in my presence. The recorded information has been reviewed and considered, and addended by me as needed.   Signed,   Meredith Staggers, MD [facility].  05/01/16 9:28 AM

## 2016-04-29 NOTE — Patient Instructions (Addendum)
  One year card given for DOT with your history of high blood pressure and diabetes. Bring a copy of your most recent hemoglobin A1c to your next due to physical. Continue routine follow-up with your primary care provider, and you can recheck a urine test at your primary care doctor's office as there was trace protein noted on your urine testing today. If any increased weakness or loss of feeling in the leg or foot, be seen by your specialist immediately, and do not drive if you're experiencing these symptoms.   IF you received an x-ray today, you will receive an invoice from [facility]. Please contact [facility] at [phone] with questions or concerns regarding your invoice.   IF you received labwork today, you will receive an invoice from [employer]. Please contact [facility] at [phone] with questions or concerns regarding your invoice.   Our billing staff will not be able to assist you with questions regarding bills from these companies.  You will be contacted with the lab results as soon as they are available. The fastest way to get your results is to activate your My Chart account. Instructions are located on the last page of this paperwork. If you have not heard from [place] regarding the results in 2 weeks, please contact this office.

## 2016-12-14 DIAGNOSIS — Z125 Encounter for screening for malignant neoplasm of prostate: Secondary | ICD-10-CM | POA: Diagnosis not present

## 2016-12-14 DIAGNOSIS — E119 Type 2 diabetes mellitus without complications: Secondary | ICD-10-CM | POA: Diagnosis not present

## 2016-12-14 DIAGNOSIS — M5417 Radiculopathy, lumbosacral region: Secondary | ICD-10-CM | POA: Diagnosis not present

## 2016-12-14 DIAGNOSIS — E781 Pure hyperglyceridemia: Secondary | ICD-10-CM | POA: Diagnosis not present

## 2016-12-14 DIAGNOSIS — I10 Essential (primary) hypertension: Secondary | ICD-10-CM | POA: Diagnosis not present

## 2017-04-26 DIAGNOSIS — I10 Essential (primary) hypertension: Secondary | ICD-10-CM | POA: Diagnosis not present

## 2017-04-26 DIAGNOSIS — E119 Type 2 diabetes mellitus without complications: Secondary | ICD-10-CM | POA: Diagnosis not present

## 2017-04-26 DIAGNOSIS — M5417 Radiculopathy, lumbosacral region: Secondary | ICD-10-CM | POA: Diagnosis not present

## 2017-08-09 DIAGNOSIS — M5417 Radiculopathy, lumbosacral region: Secondary | ICD-10-CM | POA: Diagnosis not present

## 2017-08-09 DIAGNOSIS — Z125 Encounter for screening for malignant neoplasm of prostate: Secondary | ICD-10-CM | POA: Diagnosis not present

## 2017-08-09 DIAGNOSIS — I10 Essential (primary) hypertension: Secondary | ICD-10-CM | POA: Diagnosis not present

## 2017-08-09 DIAGNOSIS — E119 Type 2 diabetes mellitus without complications: Secondary | ICD-10-CM | POA: Diagnosis not present

## 2018-01-03 ENCOUNTER — Encounter: Payer: Self-pay | Admitting: Urgent Care

## 2018-01-03 ENCOUNTER — Other Ambulatory Visit: Payer: Self-pay

## 2018-01-03 ENCOUNTER — Ambulatory Visit: Payer: 59 | Admitting: Urgent Care

## 2018-01-03 VITALS — BP 134/78 | HR 64 | Temp 97.8°F | Resp 18 | Ht 71.0 in | Wt 240.0 lb

## 2018-01-03 DIAGNOSIS — E119 Type 2 diabetes mellitus without complications: Secondary | ICD-10-CM

## 2018-01-03 DIAGNOSIS — M7989 Other specified soft tissue disorders: Secondary | ICD-10-CM

## 2018-01-03 DIAGNOSIS — I10 Essential (primary) hypertension: Secondary | ICD-10-CM

## 2018-01-03 MED ORDER — IRBESARTAN 300 MG PO TABS: 300.0000 mg | ORAL_TABLET | Freq: Every day | ORAL | 3 refills | Status: AC

## 2018-01-03 MED ORDER — AMLODIPINE BESYLATE 5 MG PO TABS: 5.0000 mg | ORAL_TABLET | Freq: Every day | ORAL | 3 refills | Status: AC

## 2018-01-03 NOTE — Progress Notes (Signed)
    MRN: [id number] DOB: 06/03/1966  Subjective:   Jeremy Mckay is a 52 y.o. male presenting for 1 month history of intermittent swelling of his hands, feet and lips. Admits that there is some pain in his feet when he has the swelling there. Also reports one instance of having swelling in his testicles. He is taking irbesartan and valsartan-amlodipine for HTN. Does not drink alcohol. Denies hives, chest tightness, shob, wheezing.   Jeremy Mckay has a current medication list which includes the following prescription(s): irbesartan, metformin, amlodipine-valsartan, and atorvastatin. Also is allergic to amoxicillin and penicillins.  Jeremy Mckay  has a past medical history of Diabetes mellitus without complication (HCC) and Hyperlipidemia. Denies past surgical history.    Objective:   Vitals: BP 134/78   Pulse 64   Temp 97.8 F (36.6 C) (Oral)   Resp 18   Ht 5\' 11"  (1.803 m)   Wt 240 lb (108.9 kg)   SpO2 96%   BMI 33.47 kg/m   BP Readings from Last 3 Encounters:  01/03/18 134/78  04/29/16 140/90  01/21/16 118/77    Physical Exam  Constitutional: He is oriented to person, place, and time. He appears well-developed and well-nourished.  HENT:  Mouth/Throat: Oropharynx is clear and moist.  Eyes: No scleral icterus.  Cardiovascular: Normal rate, regular rhythm and intact distal pulses. Exam reveals no gallop and no friction rub.  No murmur heard. Pulmonary/Chest: No stridor. No respiratory distress. He has no wheezes. He has no rales.  Musculoskeletal: He exhibits no edema.  Neurological: He is alert and oriented to person, place, and time.  Skin: Skin is warm and dry. No rash noted.  Psychiatric: He has a normal mood and affect.   Assessment and Plan :   Swelling of both hands - Plan: Uric acid, Sedimentation Rate  Bilateral swelling of feet - Plan: Uric acid, Sedimentation Rate  Controlled type 2 diabetes mellitus without complication, without long-term current use of insulin (HCC)  - Plan: Comprehensive metabolic panel, Hemoglobin A1c  Essential hypertension  Labs pending. Discussed differential which includes angioedema, gout, side effect from amlodipine. He does not have any swelling today and is completely asymptomatic. Will d/c valsartan and increase irbesartan to 300mg  once daily. Follow up with labs. Recheck for HTN in 4 weeks.  Wallis BambergMario Mika Griffitts, PA-C Primary Care at [facility] [phone] 01/03/2018  8:18 AM

## 2018-01-03 NOTE — Patient Instructions (Addendum)
Angioedema Angioedema is the sudden swelling of tissue in the body. Angioedema can affect any part of the body, but it most often affects the deeper parts of the skin, causing red, itchy patches (hives) to appear over the affected area. It often begins during the night and is found in the morning. Depending on the cause, angioedema may happen:  Only once.  Several times. It may come back in unpredictable patterns.  Repeatedly for several years. Over time, it may gradually stop coming back.  Angioedema can be life-threatening if it affects the air passages that you breathe through. What are the causes? This condition may be caused by:  Foods, such as milk, eggs, shellfish, wheat, or nuts.  Certain medicines, such as ACE inhibitors, antibiotics, nonsteroidal anti-inflammatory drugs, birth control pills, or dyes used in X-rays.  Insect stings.  Infections.  Angioedema can be inherited, and episodes can be triggered by:  Mild injury.  Dental work.  Surgery.  Stress.  Sudden changes in temperature.  Exercise.  In some cases, the cause of this condition is not known. What are the signs or symptoms? Symptoms of this condition depend on where the swelling happens. Symptoms may include:  Swollen skin.  Red, itchy patches of skin (hives).  Redness in the affected area.  Pain in the affected area.  Swollen lips or tongue.  Wheezing.  Breathing problems.  If your internal organs are involved, symptoms may also include:  Nausea.  Abdominal pain.  Vomiting.  Difficulty swallowing.  Difficulty passing urine.  How is this diagnosed? This condition may be diagnosed based on:  An exam of the affected area.  Your medical history.  Whether anyone in your family has had this condition before.  A review of any medicines you have been taking.  Tests, including: ? Allergy skin tests to see if the condition was caused by an allergic reaction. ? Blood tests to see  if the condition was caused by a gene. ? Tests to check for underlying diseases that could cause the condition.  How is this treated? Treatment for this condition depends on the cause. It may involve any of the following:  If something triggered the condition, making changes to keep it from triggering the condition again.  If the condition affects your breathing, having tubes placed in your airway to keep it open.  Taking medicines to treat symptoms or prevent future episodes. These may include: ? Antihistamines. ? Epinephrine injections. ? Steroids.  If your condition is severe, you may need to be treated at the hospital. Angioedema usually gets better in 24-48 hours. Follow these instructions at home:  Take over-the-counter and prescription medicines only as told by your health care provider.  If you were given medicines for emergency allergy treatment, always carry them with you.  Wear a medical bracelet as told by your health care provider.  If something triggers your condition, avoid the trigger, if possible.  If your condition is inherited and you are thinking about having children, talk to your health care provider. It is important to discuss the risks of passing on the condition to your children. Contact a health care provider if:  You have repeated episodes of angioedema.  Episodes of angioedema start to happen more often than they used to, even after you take steps to prevent them.  You have episodes of angioedema that are more severe than they have been before, even after you take steps to prevent them.  You are thinking about having children. Get   help right away if:  You have severe swelling of your mouth, tongue, or lips.  You have trouble breathing.  You have trouble swallowing.  You faint. This information is not intended to replace advice given to you by your health care provider. Make sure you discuss any questions you have with your health care  provider. Document Released: 11/27/2001 Document Revised: 04/15/2016 Document Reviewed: 03/28/2016 Elsevier Interactive Patient Education  2018 [employer]     IF you received an x-ray today, you will receive an invoice from [facility]. Please contact [facility] at [phone] with questions or concerns regarding your invoice.   IF you received labwork today, you will receive an invoice from [facility]. Please contact [facility] at [phone] with questions or concerns regarding your invoice.   Our billing staff will not be able to assist you with questions regarding bills from these companies.  You will be contacted with the lab results as soon as they are available. The fastest way to get your results is to activate your My Chart account. Instructions are located on the last page of this paperwork. If you have not heard from us regarding the results in 2 weeks, please contact this office.

## 2018-01-04 LAB — COMPREHENSIVE METABOLIC PANEL
ALT: 38 IU/L (ref 0–44)
AST: 23 IU/L (ref 0–40)
Albumin/Globulin Ratio: 1.9 (ref 1.2–2.2)
Albumin: 4.7 g/dL (ref 3.5–5.5)
Alkaline Phosphatase: 80 IU/L (ref 39–117)
BUN/Creatinine Ratio: 11 (ref 9–20)
BUN: 11 mg/dL (ref 6–24)
Bilirubin Total: 0.9 mg/dL (ref 0.0–1.2)
CALCIUM: 9.6 mg/dL (ref 8.7–10.2)
CO2: 22 mmol/L (ref 20–29)
CREATININE: 1 mg/dL (ref 0.76–1.27)
Chloride: 101 mmol/L (ref 96–106)
GFR calc Af Amer: 102 mL/min/{1.73_m2} (ref >59)
GFR, EST NON AFRICAN AMERICAN: 88 mL/min/{1.73_m2} (ref >59)
GLOBULIN, TOTAL: 2.5 g/dL (ref 1.5–4.5)
GLUCOSE: 122 mg/dL — AB (ref 65–99)
Potassium: 4.4 mmol/L (ref 3.5–5.2)
SODIUM: 142 mmol/L (ref 134–144)
Total Protein: 7.2 g/dL (ref 6.0–8.5)

## 2018-01-04 LAB — SEDIMENTATION RATE: SED RATE: 25 mm/h — AB (ref 0–15)

## 2018-01-04 LAB — HEMOGLOBIN A1C
Est. average glucose Bld gHb Est-mCnc: 180 mg/dL
HEMOGLOBIN A1C: 7.9 % — AB (ref 4.8–5.6)

## 2018-01-04 LAB — URIC ACID: Uric Acid: 6.5 mg/dL (ref 3.7–8.6)

## 2018-01-07 ENCOUNTER — Telehealth: Payer: Self-pay | Admitting: Urgent Care

## 2018-01-07 NOTE — Telephone Encounter (Signed)
That's okay if it is what he wants. We discussed this at length during his OV. He was taking 2 kinds of ACEi. The reason for the 300mg  dose was to help keep his BP controlled since we would be cutting back to only 1 ACEi. But if he wants 150mg , he can try that and we'll see how his BP turns out at follow up.  BP Readings from Last 3 Encounters:  01/03/18 134/78  04/29/16 140/90  01/21/16 118/77

## 2018-01-07 NOTE — Telephone Encounter (Signed)
Contacted pt and he states that he is only taking irbesartan 150 mg daily; will route to office for notification,

## 2018-01-07 NOTE — Telephone Encounter (Signed)
Patient presents in clinic for medication reconciliation and prescription request. Medications from last visit are current, however he states he is taking the 150mg  of ibesartan, is taking two of 500 mg metformin daily. He is requesting 150mg  of ibesartan be [facility] [address], states this medication works well for him. He has some tablets left of the 150mg , never picked up the 300mg  prescription from 01/03/18.   Provider, please prescribe lower dose if appropriate. .Marland Kitchen

## 2018-01-10 ENCOUNTER — Encounter: Payer: Self-pay | Admitting: *Deleted

## 2018-01-11 ENCOUNTER — Telehealth: Payer: Self-pay | Admitting: Urgent Care

## 2018-01-11 NOTE — Telephone Encounter (Signed)
Pt. Given lab results. Verbalizes understanding. 

## 2018-01-25 DIAGNOSIS — I10 Essential (primary) hypertension: Secondary | ICD-10-CM | POA: Diagnosis not present

## 2018-01-25 DIAGNOSIS — E7800 Pure hypercholesterolemia, unspecified: Secondary | ICD-10-CM | POA: Diagnosis not present

## 2018-01-25 DIAGNOSIS — E119 Type 2 diabetes mellitus without complications: Secondary | ICD-10-CM | POA: Diagnosis not present

## 2018-02-01 ENCOUNTER — Ambulatory Visit: Payer: 59 | Admitting: Urgent Care

## 2018-02-01 DIAGNOSIS — M722 Plantar fascial fibromatosis: Secondary | ICD-10-CM | POA: Diagnosis not present

## 2018-02-01 DIAGNOSIS — M71571 Other bursitis, not elsewhere classified, right ankle and foot: Secondary | ICD-10-CM | POA: Diagnosis not present

## 2018-05-31 DIAGNOSIS — E1169 Type 2 diabetes mellitus with other specified complication: Secondary | ICD-10-CM | POA: Diagnosis not present

## 2018-05-31 DIAGNOSIS — E7800 Pure hypercholesterolemia, unspecified: Secondary | ICD-10-CM | POA: Diagnosis not present

## 2018-05-31 DIAGNOSIS — I10 Essential (primary) hypertension: Secondary | ICD-10-CM | POA: Diagnosis not present

## 2018-10-16 ENCOUNTER — Other Ambulatory Visit: Payer: Self-pay | Admitting: Internal Medicine

## 2018-10-16 ENCOUNTER — Ambulatory Visit
Admission: RE | Admit: 2018-10-16 | Discharge: 2018-10-16 | Disposition: A | Discharge status: Home / Self Care | Payer: 59 | Source: Ambulatory Visit | Attending: Internal Medicine | Admitting: Internal Medicine

## 2018-10-16 DIAGNOSIS — M79641 Pain in right hand: Secondary | ICD-10-CM | POA: Diagnosis not present

## 2018-10-16 DIAGNOSIS — M79671 Pain in right foot: Secondary | ICD-10-CM | POA: Diagnosis not present

## 2018-10-16 DIAGNOSIS — M7989 Other specified soft tissue disorders: Secondary | ICD-10-CM | POA: Diagnosis not present

## 2018-10-16 DIAGNOSIS — M2550 Pain in unspecified joint: Secondary | ICD-10-CM | POA: Diagnosis not present

## 2018-10-28 DIAGNOSIS — J3089 Other allergic rhinitis: Secondary | ICD-10-CM | POA: Diagnosis not present

## 2018-10-28 DIAGNOSIS — L501 Idiopathic urticaria: Secondary | ICD-10-CM | POA: Diagnosis not present

## 2018-10-28 DIAGNOSIS — J301 Allergic rhinitis due to pollen: Secondary | ICD-10-CM | POA: Diagnosis not present

## 2018-10-28 DIAGNOSIS — J3081 Allergic rhinitis due to animal (cat) (dog) hair and dander: Secondary | ICD-10-CM | POA: Diagnosis not present

## 2018-10-28 DIAGNOSIS — T783XXA Angioneurotic edema, initial encounter: Secondary | ICD-10-CM | POA: Diagnosis not present

## 2018-12-13 DIAGNOSIS — Z0000 Encounter for general adult medical examination without abnormal findings: Secondary | ICD-10-CM | POA: Diagnosis not present

## 2018-12-13 DIAGNOSIS — E7800 Pure hypercholesterolemia, unspecified: Secondary | ICD-10-CM | POA: Diagnosis not present

## 2018-12-13 DIAGNOSIS — E1169 Type 2 diabetes mellitus with other specified complication: Secondary | ICD-10-CM | POA: Diagnosis not present

## 2018-12-13 DIAGNOSIS — I10 Essential (primary) hypertension: Secondary | ICD-10-CM | POA: Diagnosis not present

## 2018-12-24 DIAGNOSIS — G629 Polyneuropathy, unspecified: Secondary | ICD-10-CM | POA: Diagnosis not present

## 2019-01-09 DIAGNOSIS — M5127 Other intervertebral disc displacement, lumbosacral region: Secondary | ICD-10-CM | POA: Diagnosis not present

## 2019-04-24 ENCOUNTER — Other Ambulatory Visit: Payer: Self-pay | Admitting: General Practice

## 2019-04-24 DIAGNOSIS — Z20822 Contact with and (suspected) exposure to covid-19: Secondary | ICD-10-CM

## 2019-04-27 LAB — NOVEL CORONAVIRUS, NAA: SARS-CoV-2, NAA: NOT DETECTED

## 2019-06-20 ENCOUNTER — Other Ambulatory Visit: Payer: Self-pay | Admitting: Internal Medicine

## 2019-06-20 DIAGNOSIS — R1013 Epigastric pain: Secondary | ICD-10-CM

## 2019-06-20 DIAGNOSIS — R109 Unspecified abdominal pain: Secondary | ICD-10-CM

## 2019-06-23 ENCOUNTER — Other Ambulatory Visit: Payer: 59

## 2019-06-30 ENCOUNTER — Ambulatory Visit
Admission: RE | Admit: 2019-06-30 | Discharge: 2019-06-30 | Disposition: A | Discharge status: Home / Self Care | Payer: 59 | Source: Ambulatory Visit | Attending: Internal Medicine | Admitting: Internal Medicine

## 2019-06-30 DIAGNOSIS — R1013 Epigastric pain: Secondary | ICD-10-CM

## 2019-06-30 DIAGNOSIS — R109 Unspecified abdominal pain: Secondary | ICD-10-CM

## 2020-10-08 IMAGING — CR DG HAND COMPLETE 3+V*R*
3 series · 3 of 3 positions shown · non-contrast
Comparison: None.

CLINICAL DATA: Right hand pain and swelling for the past year.

EXAM:
RIGHT HAND - COMPLETE 3+ VIEW

[x hand pa right]
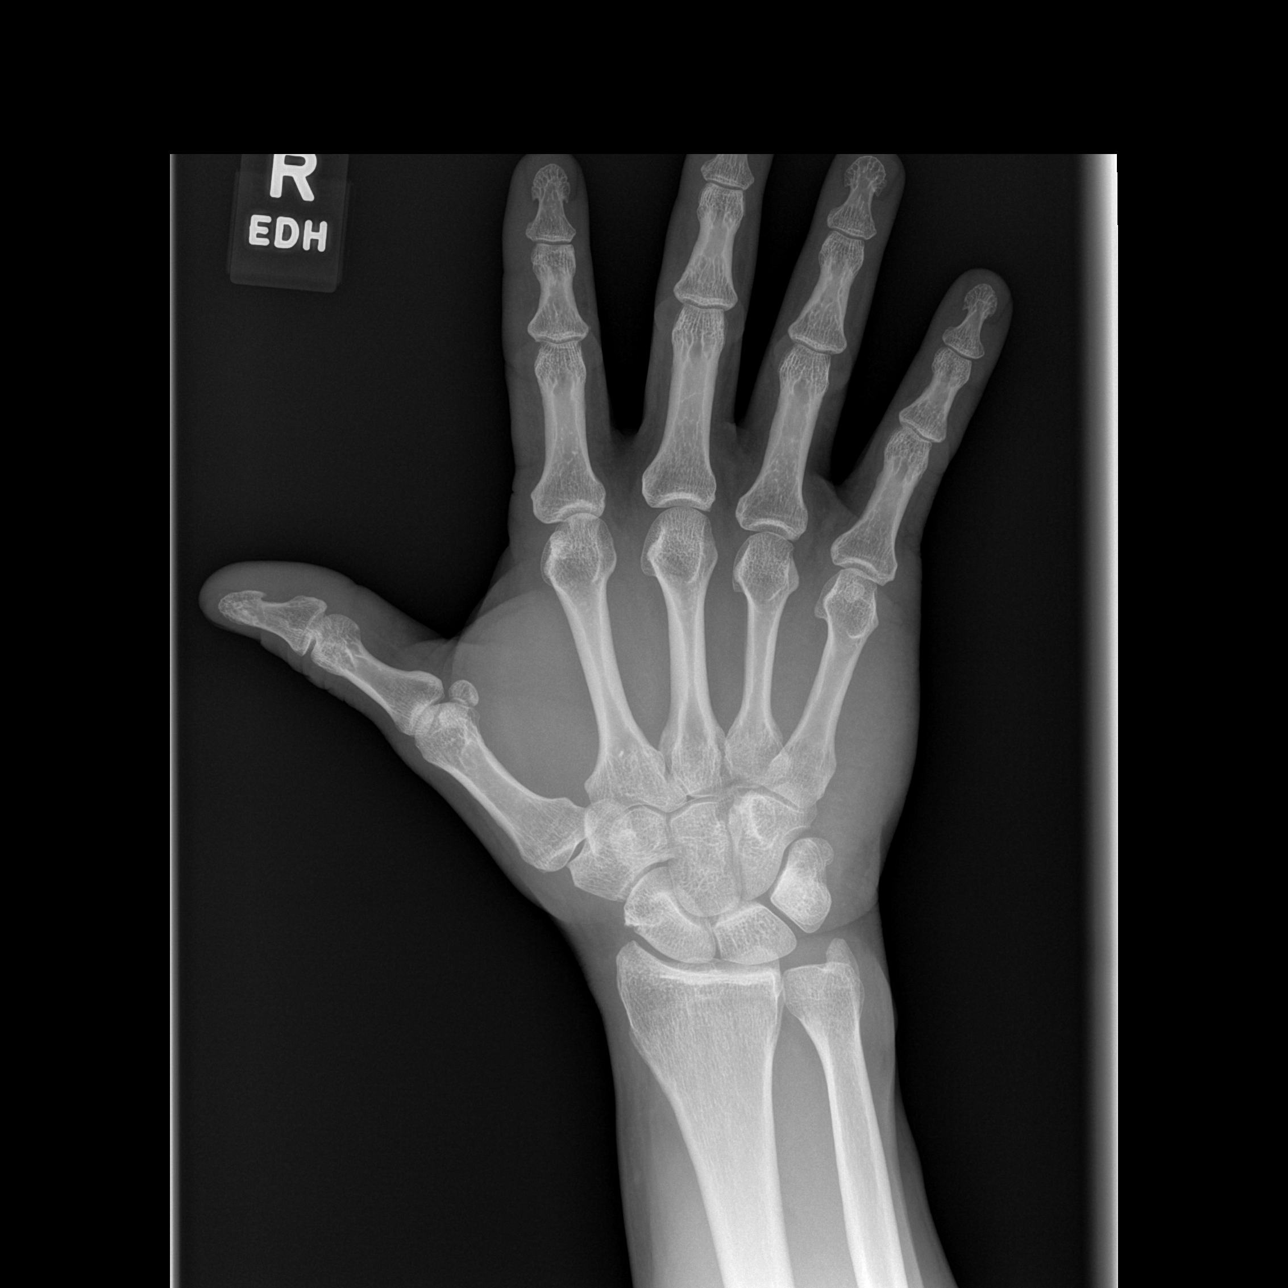

[x hand oblique right]
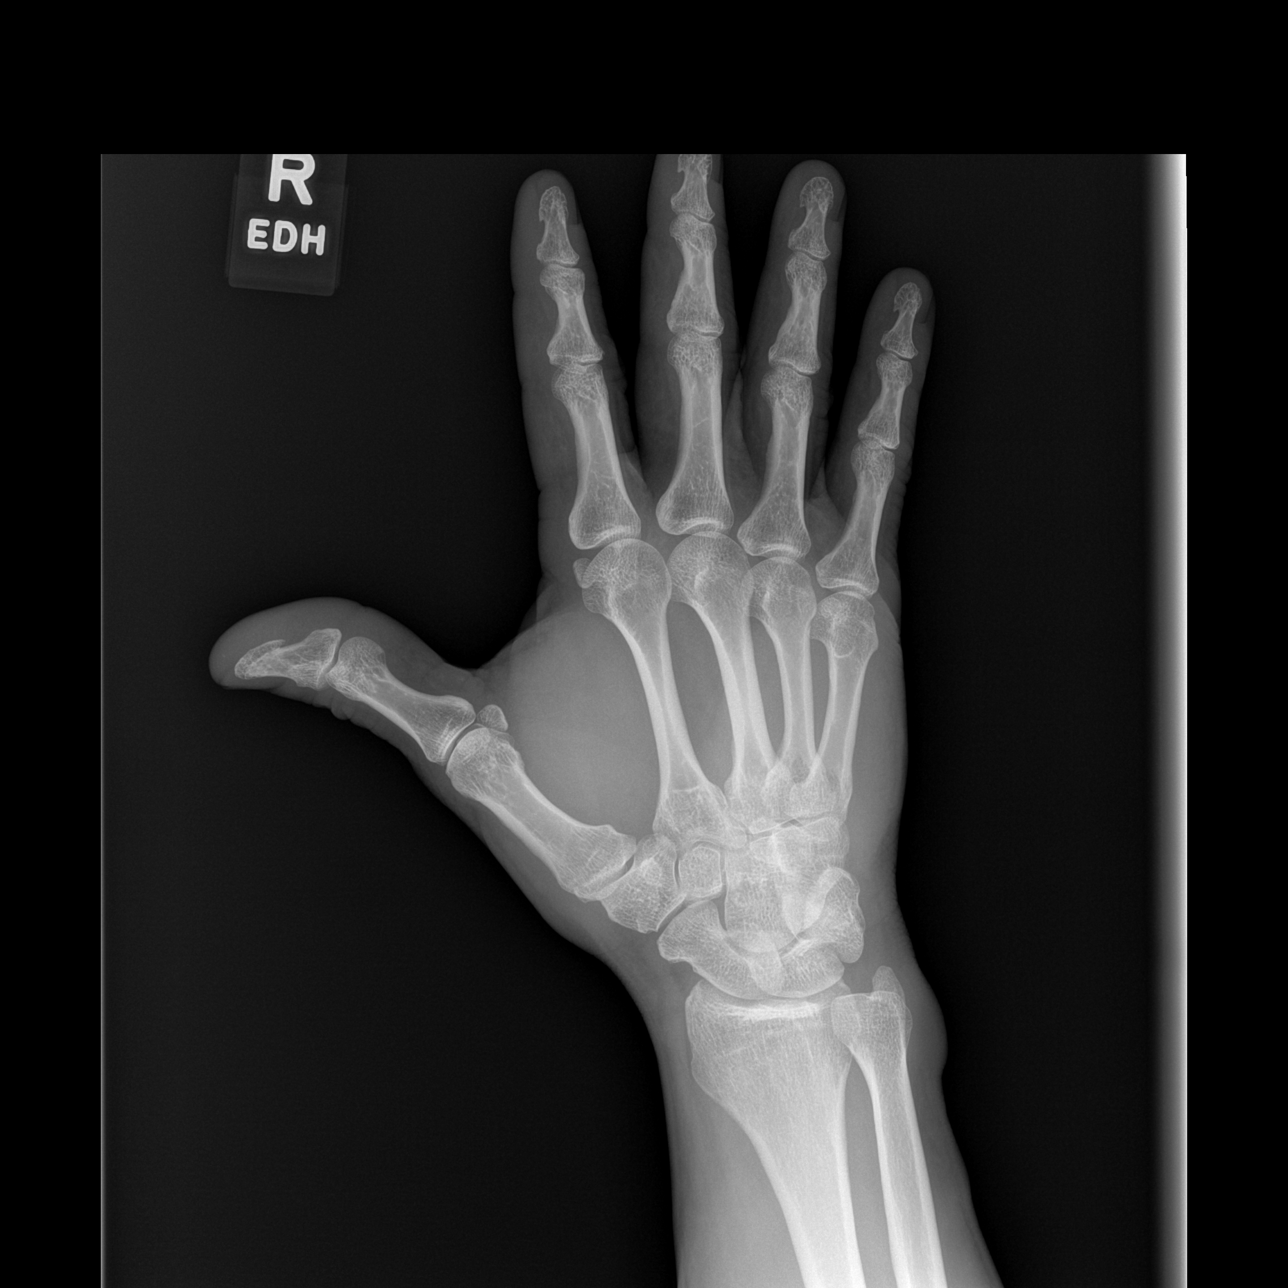

[x hand lat right]
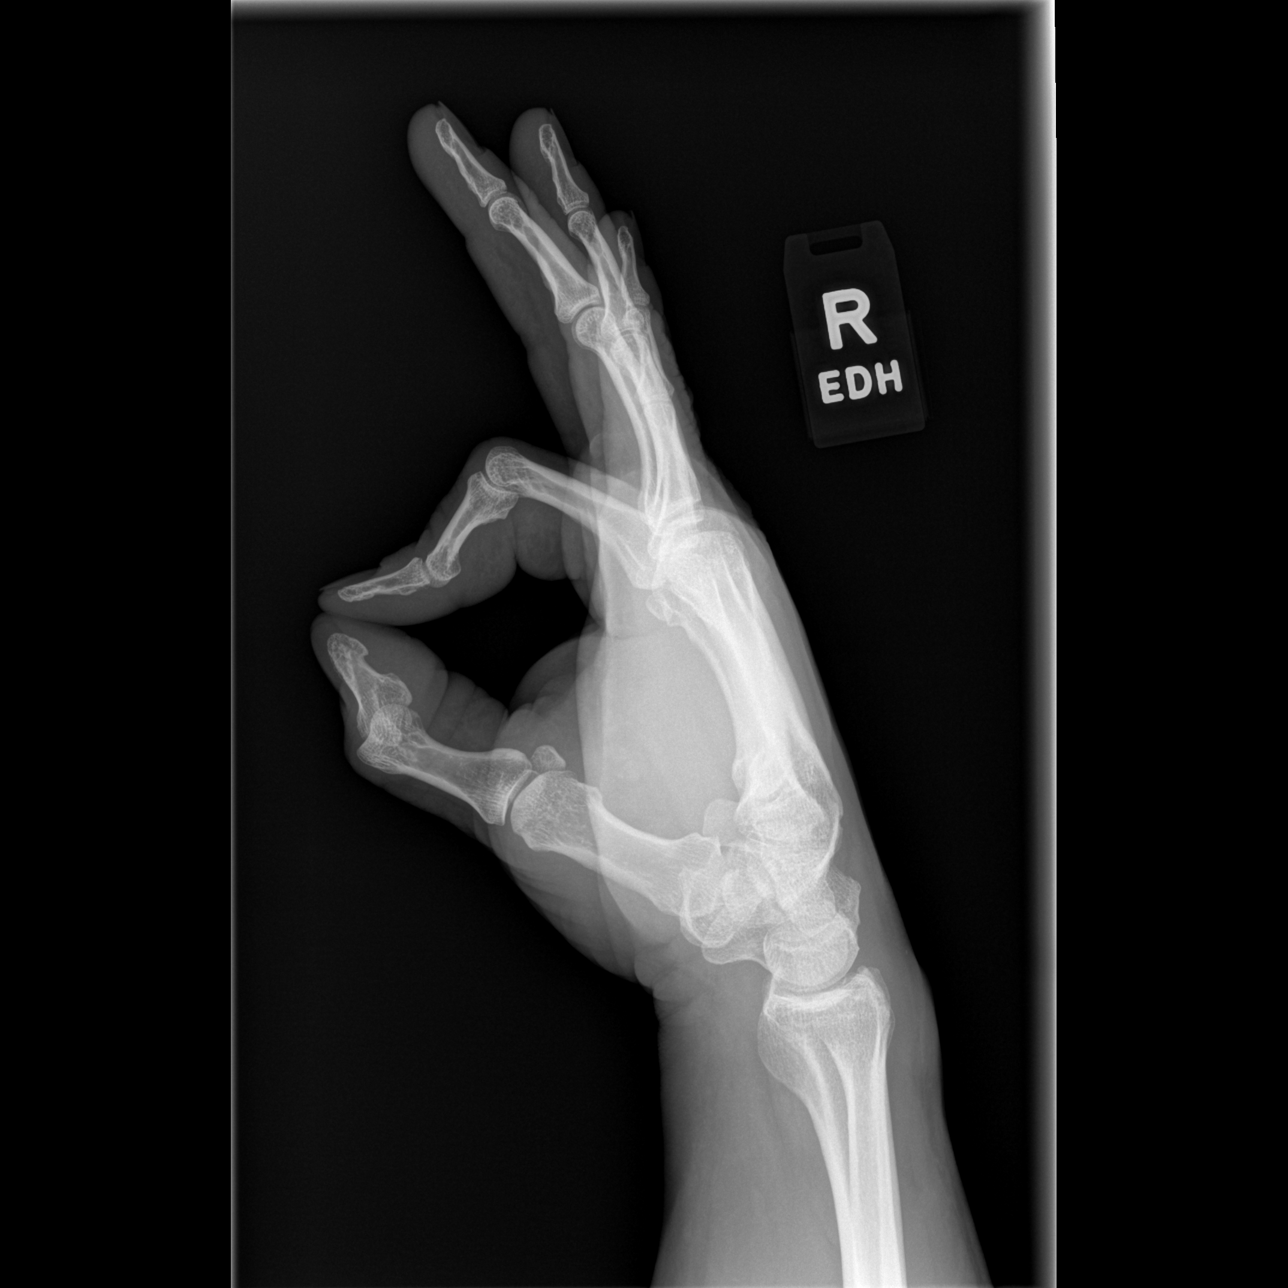

[3 of 3 positions shown; findings below may reference images not displayed]

FINDINGS: There is no evidence of fracture or dislocation. There is no
evidence of arthropathy or other focal bone abnormality. Soft
tissues are unremarkable.
IMPRESSION: Negative.

## 2021-04-01 ENCOUNTER — Ambulatory Visit: Payer: Self-pay

## 2021-04-01 ENCOUNTER — Other Ambulatory Visit: Payer: Self-pay | Admitting: Family Medicine

## 2021-04-01 ENCOUNTER — Other Ambulatory Visit: Payer: Self-pay

## 2021-04-01 DIAGNOSIS — M25521 Pain in right elbow: Secondary | ICD-10-CM

## 2021-10-02 DIAGNOSIS — N400 Enlarged prostate without lower urinary tract symptoms: Secondary | ICD-10-CM

## 2021-10-02 HISTORY — DX: Benign prostatic hyperplasia without lower urinary tract symptoms: N40.0

## 2022-07-24 ENCOUNTER — Other Ambulatory Visit: Payer: Self-pay | Admitting: Urology

## 2022-08-14 ENCOUNTER — Encounter: Payer: Self-pay | Admitting: *Deleted

## 2022-08-14 NOTE — Progress Notes (Signed)
Pt states he sees Dr. Tracey Harries at [facility] as his PCP, who has referred him to urology for procedure this month. He is then to see PCP after surgery to f/u on his elevated A1C. He acknowledges his PCP has been in contact with his urologist and surgeon is aware of his health hx, including his DM and recent labs and A1C. Pt also states he has no additional SDOH or health care needs at this time and thanked [facility] for the follow up after his screening event 04/08/22, for which he has ongoing follow up.

## 2022-08-22 ENCOUNTER — Encounter (HOSPITAL_BASED_OUTPATIENT_CLINIC_OR_DEPARTMENT_OTHER): Payer: Self-pay | Admitting: Urology

## 2022-08-22 NOTE — Progress Notes (Signed)
Spoke w/ via phone for pre-op interview--- Jeremy Mckay Lab needs dos----   EKG            Lab results------ COVID test -----patient states asymptomatic no test needed Arrive at -------1030 NPO after MN NO Solid Food.  Clear liquids from MN until---0930 Med rec completed Medications to take morning of surgery -----Norvasc Diabetic medication -----No DM medications AM of surgery Patient instructed no nail polish to be worn day of surgery Patient instructed to bring photo id and insurance card day of surgery Patient aware to have Driver (ride ) / caregiver  Wife Jeremy Mckay  for 24 hours after surgery  Patient Special Instructions ----- Patient OWER [facility] guidelines discussed, discharge by 0900 the next morning, patient verbalized understanding of all instructions. Pre-Op special Istructions ----- Patient verbalized understanding of instructions that were given at this phone interview. Patient denies shortness of breath, chest pain, fever, cough at this phone interview.

## 2022-08-28 NOTE — H&P (Signed)
CC/HPI: cc: hematospermia   03/14/22: 56 year old man comes in with 5 weeks of painless hematospermia. It does not happen every time but it is bothering him. No gross hematuria. Patient recently had PSA checked by his PCP which was reportedly normal. No family history of prostate cancer.   06/16/2022: 56 year old man comes in for follow-up for painless hematospermia. Its been happening 1 time a week since May 2023. He did do a trial of finasteride which did not help him at all. It is very bothersome to him. He has no pain associated with it.   07/19/2022: 56 year old man here for evaluation for painless hematospermia. Prostate ultrasound showed 42 g prostate. Patient continues to have intermittent hematospermia. He also reports nocturia 3 times a night and weak urinary stream.   Here today for preoperative appointment prior to undergoing diagnostic cystoscopy of the prostatic urethra and possible TURP with Dr. Arita Miss on 11/28. No changes in past medical history, prescription medications taken on a daily basis, no interval surgical or procedural intervention. He has had no recurrent dysuria or gross hematuria. Voiding symptoms continue with nocturia up to 3 times per night as well as a weak stream but this is stable for him. Has had no recent fevers or chills, nausea/vomiting, chest pain or shortness of breath, lightheadedness or dizziness.     ALLERGIES: Nuts Penicillin    MEDICATIONS: Amlodipine Besylate 5 mg tablet  Atorvastatin Calcium 20 mg tablet  Diazepam 10 mg tablet 1 tablet PO 1 hour before procedure  Glyxambi 25 mg-5 mg tablet  Irbesartan 150 mg tablet     GU PSH: Cystoscopy - 07/19/2022     NON-GU PSH: No Non-GU PSH    GU PMH: BPH w/LUTS - 07/19/2022 Hematospermia - 07/19/2022, - 06/16/2022, - 03/14/2022 Nocturia - 07/19/2022, - 06/16/2022 Post-void dribbling - 07/19/2022, - 06/16/2022      PMH Notes: Diabetes.   NON-GU PMH: Encounter for general adult medical examination  without abnormal findings, Encounter for preventive health examination Hypertension    FAMILY HISTORY: No Family History    SOCIAL HISTORY: Marital Status: Married Preferred Language: English; Ethnicity: Not Hispanic Or Latino; Race: Black or African American Current Smoking Status: Patient has never smoked.   Tobacco Use Assessment Completed: Used Tobacco in last 30 days? Does drink.  Does not drink caffeine.    REVIEW OF SYSTEMS:    GU Review Male:   Patient reports frequent urination, get up at night to urinate, and leakage of urine. Patient denies hard to postpone urination, burning/ pain with urination, stream starts and stops, trouble starting your stream, have to strain to urinate , erection problems, and penile pain.  Gastrointestinal (Upper):   Patient denies nausea, vomiting, and indigestion/ heartburn.  Gastrointestinal (Lower):   Patient denies diarrhea and constipation.  Constitutional:   Patient denies fever, night sweats, weight loss, and fatigue.  Skin:   Patient denies skin rash/ lesion and itching.  Eyes:   Patient denies blurred vision and double vision.  Ears/ Nose/ Throat:   Patient denies sinus problems and sore throat.  Hematologic/Lymphatic:   Patient denies swollen glands and easy bruising.  Cardiovascular:   Patient denies leg swelling and chest pains.  Respiratory:   Patient denies cough and shortness of breath.  Endocrine:   Patient denies excessive thirst.  Musculoskeletal:   Patient denies back pain and joint pain.  Neurological:   Patient denies headaches and dizziness.  Psychologic:   Patient denies depression and anxiety.   VITAL SIGNS:  08/22/2022 02:52 PM  Weight 240 lb / 108.86 kg  Height 72 in / 182.88 cm  BP 134/75 mmHg  Heart Rate 76 /min  Temperature 99.3 F / 37.3 C  BMI 32.5 kg/m   MULTI-SYSTEM PHYSICAL EXAMINATION:    Constitutional: Well-nourished. No physical deformities. Normally developed. Good grooming.  Neck: Neck  symmetrical, not swollen. Normal tracheal position.  Respiratory: No labored breathing, no use of accessory muscles.   Cardiovascular: Normal temperature, normal extremity pulses, no swelling, no varicosities.  Skin: No paleness, no jaundice, no cyanosis. No lesion, no ulcer, no rash.  Neurologic / Psychiatric: Oriented to time, oriented to place, oriented to person. No depression, no anxiety, no agitation.  Musculoskeletal: Normal gait and station of head and neck.     Complexity of Data:  Source Of History:  Patient, Medical Record Summary  Lab Test Review:   PSA  Records Review:   Previous Doctor Records, Previous Hospital Records, Previous Patient Records  Urine Test Review:   Urinalysis  X-Ray Review: Prostate Ultrasound: Reviewed Films.     06/16/22  PSA  Total PSA 0.44 ng/mL    08/22/22  Urinalysis  Urine Appearance Clear   Urine Color Straw   Urine Glucose 3+ mg/dL  Urine Bilirubin Neg mg/dL  Urine Ketones Neg mg/dL  Urine Specific Gravity 1.015   Urine Blood Neg ery/uL  Urine pH 5.5   Urine Protein Neg mg/dL  Urine Urobilinogen 0.2 mg/dL  Urine Nitrites Neg   Urine Leukocyte Esterase Neg leu/uL   PROCEDURES:          Urinalysis Dipstick Dipstick Cont'd  Color: Straw Bilirubin: Neg mg/dL  Appearance: Clear Ketones: Neg mg/dL  Specific Gravity: 3.614 Blood: Neg ery/uL  pH: 5.5 Protein: Neg mg/dL  Glucose: 3+ mg/dL Urobilinogen: 0.2 mg/dL    Nitrites: Neg    Leukocyte Esterase: Neg leu/uL    ASSESSMENT:      ICD-10 Details  1 GU:   BPH w/LUTS - N40.1 Chronic, Stable  2   Nocturia - R35.1 Chronic, Stable  3   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 Urine culture sent today to serve as a baseline. He will proceed with previously scheduled procedure on 11/28.

## 2022-08-28 NOTE — Anesthesia Preprocedure Evaluation (Signed)
Anesthesia Evaluation  Patient identified by MRN, date of birth, ID band Patient awake    Reviewed: Allergy & Precautions, NPO status , Patient's Chart, lab work & pertinent test results  Airway Mallampati: II  TM Distance: >3 FB Neck ROM: Full    Dental no notable dental hx.    Pulmonary neg pulmonary ROS   Pulmonary exam normal breath sounds clear to auscultation       Cardiovascular hypertension, Pt. on medications Normal cardiovascular exam Rhythm:Regular Rate:Normal     Neuro/Psych negative neurological ROS  negative psych ROS   GI/Hepatic negative GI ROS, Neg liver ROS,,,  Endo/Other  diabetes, Type 2    Renal/GU Lab Results      Component                Value               Date                      CREATININE               1.10                08/29/2022                BUN                      9                   08/29/2022                NA                       143                 08/29/2022                K                        4.1                 08/29/2022                CL                       105                 08/29/2022                CO2                      22                  01/03/2018              Hematospermia    Musculoskeletal negative musculoskeletal ROS (+)    Abdominal  (+) + obese (BMI 31.9)  Peds  Hematology Lab Results      Component                Value               Date                      HGB  16.7                08/29/2022                HCT                      49.0                08/29/2022              Anesthesia Other Findings ALL: amoxicillin, PCN  Reproductive/Obstetrics                             Anesthesia Physical Anesthesia Plan  ASA: 3  Anesthesia Plan: General   Post-op Pain Management: Ofirmev IV (intra-op)* and Precedex   Induction: Intravenous  PONV Risk Score and Plan: 3 and Treatment may vary due to  age or medical condition, Midazolam, Ondansetron and Dexamethasone  Airway Management Planned: LMA  Additional Equipment: None  Intra-op Plan:   Post-operative Plan:   Informed Consent: I have reviewed the patients History and Physical, chart, labs and discussed the procedure including the risks, benefits and alternatives for the proposed anesthesia with the patient or authorized representative who has indicated his/her understanding and acceptance.     Dental advisory given  Plan Discussed with:   Anesthesia Plan Comments:        Anesthesia Quick Evaluation

## 2022-08-29 ENCOUNTER — Ambulatory Visit (HOSPITAL_BASED_OUTPATIENT_CLINIC_OR_DEPARTMENT_OTHER): Payer: 59 | Admitting: Anesthesiology

## 2022-08-29 ENCOUNTER — Encounter (HOSPITAL_BASED_OUTPATIENT_CLINIC_OR_DEPARTMENT_OTHER)
Admission: RE | Disposition: A | Discharge status: Home / Self Care | Payer: Self-pay | Source: Home / Self Care | Attending: Urology

## 2022-08-29 ENCOUNTER — Ambulatory Visit (HOSPITAL_BASED_OUTPATIENT_CLINIC_OR_DEPARTMENT_OTHER)
Admission: RE | Admit: 2022-08-29 | Discharge: 2022-08-30 | Disposition: A | Discharge status: Home / Self Care | Payer: 59 | Attending: Urology | Admitting: Urology

## 2022-08-29 ENCOUNTER — Encounter (HOSPITAL_BASED_OUTPATIENT_CLINIC_OR_DEPARTMENT_OTHER): Payer: Self-pay | Admitting: Urology

## 2022-08-29 ENCOUNTER — Other Ambulatory Visit: Payer: Self-pay

## 2022-08-29 DIAGNOSIS — N138 Other obstructive and reflux uropathy: Secondary | ICD-10-CM | POA: Diagnosis present

## 2022-08-29 DIAGNOSIS — N401 Enlarged prostate with lower urinary tract symptoms: Secondary | ICD-10-CM | POA: Diagnosis not present

## 2022-08-29 DIAGNOSIS — N3289 Other specified disorders of bladder: Secondary | ICD-10-CM | POA: Diagnosis not present

## 2022-08-29 DIAGNOSIS — I10 Essential (primary) hypertension: Secondary | ICD-10-CM | POA: Diagnosis not present

## 2022-08-29 DIAGNOSIS — E119 Type 2 diabetes mellitus without complications: Secondary | ICD-10-CM | POA: Diagnosis not present

## 2022-08-29 DIAGNOSIS — Z6832 Body mass index (BMI) 32.0-32.9, adult: Secondary | ICD-10-CM | POA: Insufficient documentation

## 2022-08-29 DIAGNOSIS — R3912 Poor urinary stream: Secondary | ICD-10-CM | POA: Diagnosis not present

## 2022-08-29 DIAGNOSIS — E669 Obesity, unspecified: Secondary | ICD-10-CM | POA: Diagnosis not present

## 2022-08-29 DIAGNOSIS — R351 Nocturia: Secondary | ICD-10-CM | POA: Insufficient documentation

## 2022-08-29 DIAGNOSIS — R361 Hematospermia: Secondary | ICD-10-CM | POA: Diagnosis not present

## 2022-08-29 DIAGNOSIS — Z7984 Long term (current) use of oral hypoglycemic drugs: Secondary | ICD-10-CM

## 2022-08-29 DIAGNOSIS — N320 Bladder-neck obstruction: Secondary | ICD-10-CM | POA: Insufficient documentation

## 2022-08-29 DIAGNOSIS — N400 Enlarged prostate without lower urinary tract symptoms: Secondary | ICD-10-CM | POA: Diagnosis not present

## 2022-08-29 HISTORY — PX: TRANSURETHRAL RESECTION OF PROSTATE: SHX73

## 2022-08-29 HISTORY — DX: Essential (primary) hypertension: I10

## 2022-08-29 LAB — GLUCOSE, CAPILLARY
Glucose-Capillary: 116 mg/dL — ABNORMAL HIGH (ref 70–99)
Glucose-Capillary: 262 mg/dL — ABNORMAL HIGH (ref 70–99)
Glucose-Capillary: 287 mg/dL — ABNORMAL HIGH (ref 70–99)

## 2022-08-29 LAB — POCT I-STAT, CHEM 8
BUN: 9 mg/dL (ref 6–20)
Calcium, Ion: 1.26 mmol/L (ref 1.15–1.40)
Chloride: 105 mmol/L (ref 98–111)
Creatinine, Ser: 1.1 mg/dL (ref 0.61–1.24)
Glucose, Bld: 116 mg/dL — ABNORMAL HIGH (ref 70–99)
HCT: 49 % (ref 39.0–52.0)
Hemoglobin: 16.7 g/dL (ref 13.0–17.0)
Potassium: 4.1 mmol/L (ref 3.5–5.1)
Sodium: 143 mmol/L (ref 135–145)
TCO2: 24 mmol/L (ref 22–32)

## 2022-08-29 SURGERY — TURP (TRANSURETHRAL RESECTION OF PROSTATE)
Anesthesia: General

## 2022-08-29 MED ORDER — OXYBUTYNIN CHLORIDE 5 MG PO TABS: 5.0000 mg | ORAL_TABLET | Freq: Three times a day (TID) | ORAL | Status: DC | PRN

## 2022-08-29 MED ORDER — SODIUM CHLORIDE 0.9% FLUSH: 3.0000 mL | Freq: Two times a day (BID) | INTRAVENOUS | Status: DC

## 2022-08-29 MED ORDER — 0.9 % SODIUM CHLORIDE (POUR BTL) OPTIME
TOPICAL | Status: DC | PRN
  Administered 2022-08-29: 500 mL

## 2022-08-29 MED ORDER — EPHEDRINE SULFATE-NACL 50-0.9 MG/10ML-% IV SOSY
PREFILLED_SYRINGE | INTRAVENOUS | Status: DC | PRN
  Administered 2022-08-29: 5 mg via INTRAVENOUS
  Administered 2022-08-29: 10 mg via INTRAVENOUS

## 2022-08-29 MED ORDER — ACETAMINOPHEN 500 MG PO TABS
ORAL_TABLET | ORAL | Status: AC
  Filled 2022-08-29: qty 2

## 2022-08-29 MED ORDER — ONDANSETRON HCL 4 MG/2ML IJ SOLN
INTRAMUSCULAR | Status: DC | PRN
  Administered 2022-08-29: 4 mg via INTRAVENOUS

## 2022-08-29 MED ORDER — SENNOSIDES-DOCUSATE SODIUM 8.6-50 MG PO TABS
2.0000 | ORAL_TABLET | Freq: Every day | ORAL | Status: DC
  Administered 2022-08-29: 2 via ORAL
  Filled 2022-08-29: qty 2

## 2022-08-29 MED ORDER — LIDOCAINE 2% (20 MG/ML) 5 ML SYRINGE
INTRAMUSCULAR | Status: DC | PRN
  Administered 2022-08-29: 100 mg via INTRAVENOUS

## 2022-08-29 MED ORDER — ACETAMINOPHEN 500 MG PO TABS
1000.0000 mg | ORAL_TABLET | Freq: Four times a day (QID) | ORAL | Status: DC
  Administered 2022-08-29 – 2022-08-30 (×3): 1000 mg via ORAL

## 2022-08-29 MED ORDER — ATORVASTATIN CALCIUM 20 MG PO TABS
20.0000 mg | ORAL_TABLET | Freq: Every day | ORAL | Status: DC
  Administered 2022-08-29: 20 mg via ORAL
  Filled 2022-08-29: qty 1

## 2022-08-29 MED ORDER — PROPOFOL 10 MG/ML IV BOLUS
INTRAVENOUS | Status: DC | PRN
  Administered 2022-08-29: 150 mg via INTRAVENOUS
  Administered 2022-08-29: 20 mg via INTRAVENOUS

## 2022-08-29 MED ORDER — GLIPIZIDE ER 2.5 MG PO TB24
2.5000 mg | ORAL_TABLET | Freq: Every day | ORAL | Status: DC
  Administered 2022-08-29: 2.5 mg via ORAL
  Filled 2022-08-29: qty 1

## 2022-08-29 MED ORDER — MIDAZOLAM HCL 2 MG/2ML IJ SOLN
INTRAMUSCULAR | Status: AC
  Filled 2022-08-29: qty 2

## 2022-08-29 MED ORDER — SODIUM CHLORIDE 0.9 % IV SOLN: INTRAVENOUS | Status: DC

## 2022-08-29 MED ORDER — FENTANYL CITRATE (PF) 100 MCG/2ML IJ SOLN
INTRAMUSCULAR | Status: AC
  Filled 2022-08-29: qty 2

## 2022-08-29 MED ORDER — FENTANYL CITRATE (PF) 100 MCG/2ML IJ SOLN
INTRAMUSCULAR | Status: DC | PRN
  Administered 2022-08-29 (×3): 50 ug via INTRAVENOUS

## 2022-08-29 MED ORDER — PHENYLEPHRINE 80 MCG/ML (10ML) SYRINGE FOR IV PUSH (FOR BLOOD PRESSURE SUPPORT)
PREFILLED_SYRINGE | INTRAVENOUS | Status: DC | PRN
  Administered 2022-08-29 (×4): 160 ug via INTRAVENOUS

## 2022-08-29 MED ORDER — DEXMEDETOMIDINE HCL IN NACL 200 MCG/50ML IV SOLN
INTRAVENOUS | Status: DC | PRN
  Administered 2022-08-29: 12 ug via INTRAVENOUS

## 2022-08-29 MED ORDER — MIDAZOLAM HCL 5 MG/5ML IJ SOLN
INTRAMUSCULAR | Status: DC | PRN
  Administered 2022-08-29: 2 mg via INTRAVENOUS

## 2022-08-29 MED ORDER — ONDANSETRON HCL 4 MG/2ML IJ SOLN: 4.0000 mg | INTRAMUSCULAR | Status: DC | PRN

## 2022-08-29 MED ORDER — DEXMEDETOMIDINE HCL IN NACL 80 MCG/20ML IV SOLN
INTRAVENOUS | Status: AC
  Filled 2022-08-29: qty 20

## 2022-08-29 MED ORDER — SODIUM CHLORIDE 0.9 % IR SOLN
Status: DC | PRN
  Administered 2022-08-29 (×8): 3000 mL via INTRAVESICAL

## 2022-08-29 MED ORDER — SODIUM CHLORIDE 0.9 % IV SOLN: 250.0000 mL | INTRAVENOUS | Status: DC | PRN

## 2022-08-29 MED ORDER — CIPROFLOXACIN IN D5W 400 MG/200ML IV SOLN
400.0000 mg | INTRAVENOUS | Status: AC
  Administered 2022-08-29: 400 mg via INTRAVENOUS

## 2022-08-29 MED ORDER — EPHEDRINE 5 MG/ML INJ
INTRAVENOUS | Status: AC
  Filled 2022-08-29: qty 5

## 2022-08-29 MED ORDER — DIPHENHYDRAMINE HCL 50 MG/ML IJ SOLN: 12.5000 mg | Freq: Four times a day (QID) | INTRAMUSCULAR | Status: DC | PRN

## 2022-08-29 MED ORDER — SODIUM CHLORIDE 0.9 % IR SOLN: 3000.0000 mL | Status: DC

## 2022-08-29 MED ORDER — OXYCODONE HCL 5 MG PO TABS: 5.0000 mg | ORAL_TABLET | ORAL | Status: DC | PRN

## 2022-08-29 MED ORDER — PHENYLEPHRINE 80 MCG/ML (10ML) SYRINGE FOR IV PUSH (FOR BLOOD PRESSURE SUPPORT)
PREFILLED_SYRINGE | INTRAVENOUS | Status: AC
  Filled 2022-08-29: qty 10

## 2022-08-29 MED ORDER — MORPHINE SULFATE (PF) 4 MG/ML IV SOLN: 2.0000 mg | INTRAVENOUS | Status: DC | PRN

## 2022-08-29 MED ORDER — CIPROFLOXACIN IN D5W 400 MG/200ML IV SOLN
INTRAVENOUS | Status: AC
  Filled 2022-08-29: qty 200

## 2022-08-29 MED ORDER — METFORMIN HCL 500 MG PO TABS
1000.0000 mg | ORAL_TABLET | Freq: Every day | ORAL | Status: DC
  Administered 2022-08-29: 1000 mg via ORAL
  Filled 2022-08-29: qty 2

## 2022-08-29 MED ORDER — SODIUM CHLORIDE 0.9% FLUSH: 3.0000 mL | INTRAVENOUS | Status: DC | PRN

## 2022-08-29 MED ORDER — DIPHENHYDRAMINE HCL 12.5 MG/5ML PO ELIX: 12.5000 mg | ORAL_SOLUTION | Freq: Four times a day (QID) | ORAL | Status: DC | PRN

## 2022-08-29 MED ORDER — AMLODIPINE BESYLATE 5 MG PO TABS
5.0000 mg | ORAL_TABLET | Freq: Every day | ORAL | Status: DC
  Administered 2022-08-29: 5 mg via ORAL
  Filled 2022-08-29: qty 1

## 2022-08-29 MED ORDER — IRBESARTAN 150 MG PO TABS
150.0000 mg | ORAL_TABLET | Freq: Every day | ORAL | Status: DC
  Administered 2022-08-29: 150 mg via ORAL
  Filled 2022-08-29: qty 1

## 2022-08-29 MED ORDER — TRIPLE ANTIBIOTIC 3.5-400-5000 EX OINT: 1.0000 | TOPICAL_OINTMENT | Freq: Three times a day (TID) | CUTANEOUS | Status: DC | PRN

## 2022-08-29 MED ORDER — DEXAMETHASONE SODIUM PHOSPHATE 10 MG/ML IJ SOLN
INTRAMUSCULAR | Status: DC | PRN
  Administered 2022-08-29: 5 mg via INTRAVENOUS

## 2022-08-29 MED ORDER — ZOLPIDEM TARTRATE 5 MG PO TABS: 5.0000 mg | ORAL_TABLET | Freq: Every evening | ORAL | Status: DC | PRN

## 2022-08-29 MED ORDER — STERILE WATER FOR IRRIGATION IR SOLN
Status: DC | PRN
  Administered 2022-08-29: 500 mL

## 2022-08-29 MED ORDER — LACTATED RINGERS IV SOLN: INTRAVENOUS | Status: DC

## 2022-08-29 SURGICAL SUPPLY — 20 items
BAG DRAIN URO-CYSTO SKYTR STRL (DRAIN) ×1 IMPLANT
BAG DRN RND TRDRP ANRFLXCHMBR (UROLOGICAL SUPPLIES) ×1
BAG DRN UROCATH (DRAIN) ×1
BAG URINE DRAIN 2000ML AR STRL (UROLOGICAL SUPPLIES) ×1 IMPLANT
CATH FOLEY 3WAY 30CC 22FR (CATHETERS) ×1 IMPLANT
CATH HEMA 3WAY 30CC 22FR COUDE (CATHETERS) ×1 IMPLANT
CLOTH BEACON ORANGE TIMEOUT ST (SAFETY) ×1 IMPLANT
GLOVE BIO SURGEON STRL SZ 6.5 (GLOVE) ×1 IMPLANT
GOWN STRL REUS W/TWL LRG LVL3 (GOWN DISPOSABLE) ×1 IMPLANT
HOLDER FOLEY CATH W/STRAP (MISCELLANEOUS) ×1 IMPLANT
IV NS IRRIG 3000ML ARTHROMATIC (IV SOLUTION) ×2 IMPLANT
KIT TURNOVER CYSTO (KITS) ×1 IMPLANT
LOOP CUT BIPOLAR 24F LRG (ELECTROSURGICAL) IMPLANT
MANIFOLD NEPTUNE II (INSTRUMENTS) ×1 IMPLANT
PACK CYSTO (CUSTOM PROCEDURE TRAY) ×1 IMPLANT
SYR 30ML LL (SYRINGE) IMPLANT
SYR TOOMEY IRRIG 70ML (MISCELLANEOUS) ×1
SYRINGE TOOMEY IRRIG 70ML (MISCELLANEOUS) ×1 IMPLANT
TUBE CONNECTING 12X1/4 (SUCTIONS) ×1 IMPLANT
TUBING UROLOGY SET (TUBING) ×1 IMPLANT

## 2022-08-29 NOTE — Anesthesia Postprocedure Evaluation (Signed)
Anesthesia Post Note  Patient: Jeremy Mckay  Procedure(s) Performed: TRANSURETHRAL RESECTION OF THE PROSTATE (TURP); PROSTATE BIOPSY     Patient location during evaluation: PACU Anesthesia Type: General Level of consciousness: awake and alert Pain management: pain level controlled Vital Signs Assessment: post-procedure vital signs reviewed and stable Respiratory status: spontaneous breathing, nonlabored ventilation, respiratory function stable and patient connected to nasal cannula oxygen Cardiovascular status: blood pressure returned to baseline and stable Postop Assessment: no apparent nausea or vomiting Anesthetic complications: no  No notable events documented.  Last Vitals:  Vitals:   08/29/22 1415 08/29/22 1434  BP: 137/76 139/83  Pulse: 78 75  Resp: 18 18  Temp:  36.6 C  SpO2: 98% 99%    Last Pain:  Vitals:   08/29/22 1434  TempSrc:   PainSc: 2                  Trevor Iha

## 2022-08-29 NOTE — Transfer of Care (Signed)
Immediate Anesthesia Transfer of Care Note  Patient: Jeremy Mckay  Procedure(s) Performed: TRANSURETHRAL RESECTION OF THE PROSTATE (TURP); PROSTATE BIOPSY  Patient Location: PACU  Anesthesia Type:General  Level of Consciousness: drowsy, patient cooperative, and responds to stimulation  Airway & Oxygen Therapy: Patient Spontanous Breathing and Patient connected to face mask oxygen  Post-op Assessment: Report given to RN and Post -op Vital signs reviewed and stable  Post vital signs: Reviewed and stable  Last Vitals:  Vitals Value Taken Time  BP 134/63 08/29/22 1342  Temp    Pulse 77 08/29/22 1344  Resp 16 08/29/22 1344  SpO2 100 % 08/29/22 1344  Vitals shown include unvalidated device data.  Last Pain:  Vitals:   08/29/22 1034  TempSrc: Oral  PainSc: 0-No pain      Patients Stated Pain Goal: 0 (08/29/22 1034)  Complications: No notable events documented.

## 2022-08-29 NOTE — Anesthesia Procedure Notes (Signed)
Procedure Name: LMA Insertion Date/Time: 08/29/2022 12:31 PM  Performed by: Bishop Limbo, CRNAPre-anesthesia Checklist: Patient identified, Emergency Drugs available, Suction available and Patient being monitored Patient Re-evaluated:Patient Re-evaluated prior to induction Oxygen Delivery Method: Circle System Utilized Preoxygenation: Pre-oxygenation with 100% oxygen Induction Type: IV induction Ventilation: Mask ventilation without difficulty LMA: LMA inserted LMA Size: 5.0 Number of attempts: 1 Placement Confirmation: positive ETCO2 Tube secured with: Tape Dental Injury: Teeth and Oropharynx as per pre-operative assessment

## 2022-08-29 NOTE — Op Note (Signed)
Preoperative diagnosis: Bladder outlet obstruction secondary to BPH Hematospermia, questionable prostatic urethral lesion  Postoperative diagnosis:  Bladder outlet obstruction secondary to BPH Hematospermia, questionable prostatic urethral lesion  Procedure:  Cystoscopy Transurethral resection of the prostate Prostatic urethral biopsy Urethral meatus dilation 22 French to 28 [place]  Surgeon: Kasandra Knudsen, M.D.  Anesthesia: General  Complications: None  EBL: Minimal  Specimens: Prostatic urethral biopsy Prostate chips  Findings: Normal anterior urethra Prostatic urethra with small subcentimeter papillary lesion adjacent to verumontanum on the right side Lateral lobe hyperplasia and tight bladder neck Bilateral orthotopic Uos Mild trabeculation of the bladder, no bladder masses or lesions  Indication: Jeremy Mckay is a patient with ongoing hematospermia and questionable prostatic urethral lesion on diagnostic cystoscopy in the office also with BPH resulting in bladder outlet obstruction.  After reviewing the management options for treatment, he elected to proceed with the above surgical procedure(s). We have discussed the potential benefits and risks of the procedure, side effects of the proposed treatment, the likelihood of the patient achieving the goals of the procedure, and any potential problems that might occur during the procedure or recuperation. Informed consent has been obtained.  Description of procedure:  The patient was taken to the operating room and general anesthesia was induced.  The patient was placed in the dorsal lithotomy position, prepped and draped in the usual sterile fashion, and preoperative antibiotics were administered. A preoperative time-out was performed.   The urethral meatus was dilated from 22 [place] to 28 [place].  The resectoscope using the visual obturator was then placed in the urethral meatus and advanced into the bladder under direct  visualization.  The obturator was removed and the working element with bipolar loop was then assembled.  Cystourethroscopy was performed.  The patient's urethra was examined demonstrated bilobar prostatic hypertrophy with a high riding bladder neck.  The bladder was then systematically examined in its entirety. There was no evidence of any bladder tumors, stones, or other mucosal pathology. The ureteral orifices were identified and marked so as to be avoided during the procedure.  There was a small subcentimeter papillary lesion seen adjacent to the verumontanum on the right side of the prostatic urethra.  This was removed with the bipolar loop.  It was sent separately as prostatic urethral biopsy.  Next, the prostate adenoma was then resected utilizing loop cautery resection with the bipolar cutting loop.  The prostate adenoma from the bladder neck back to the verumontanum was resected beginning at the six o'clock position and then extended to include the right and left lobes of the prostate and anterior prostate. Care was taken not to resect distal to the verumontanum.   Hemostasis was then achieved with the cautery and the bladder was emptied and reinspected with no significant bleeding noted at the end of the procedure.    A 22 French three-way hematuriacatheter was then placed into the bladder and continuous bladder irrigation was initiated.  The patient appeared to tolerate the procedure well and without complications.  The patient was able to be awakened and transferred to the recovery unit in satisfactory condition.

## 2022-08-29 NOTE — Interval H&P Note (Signed)
History and Physical Interval Note: Had discussion regarding full TURP vs possible small biopsy.  Patient is most interested in having bleeding stopped.  He is okay proceeding with TURP and understands the risks.  Informed consent obtained.  08/29/2022 12:17 PM  Jeremy Mckay  has presented today for surgery, with the diagnosis of BENIGN PROSTATIC HYPERTROPHY.  The various methods of treatment have been discussed with the patient and family. After consideration of risks, benefits and other options for treatment, the patient has consented to  Procedure(s) with comments: TRANSURETHRAL RESECTION OF THE PROSTATE (TURP) (N/A) - 1 HR as a surgical intervention.  The patient's history has been reviewed, patient examined, no change in status, stable for surgery.  I have reviewed the patient's chart and labs.  Questions were answered to the patient's satisfaction.     [name] D Shelvy Perazzo

## 2022-08-30 ENCOUNTER — Encounter (HOSPITAL_BASED_OUTPATIENT_CLINIC_OR_DEPARTMENT_OTHER): Payer: Self-pay | Admitting: Urology

## 2022-08-30 LAB — GLUCOSE, CAPILLARY: Glucose-Capillary: 164 mg/dL — ABNORMAL HIGH (ref 70–99)

## 2022-08-30 LAB — SURGICAL PATHOLOGY

## 2022-08-30 MED ORDER — OXYCODONE HCL 5 MG PO TABS: 5.0000 mg | ORAL_TABLET | ORAL | 0 refills | Status: AC | PRN

## 2022-08-30 MED ORDER — ACETAMINOPHEN 500 MG PO TABS
ORAL_TABLET | ORAL | Status: AC
  Filled 2022-08-30: qty 2

## 2022-08-30 NOTE — Discharge Summary (Signed)
Date of admission: 08/29/2022  Date of discharge: 08/30/2022  Admission diagnosis: BPH with BOO, hematospermia  Discharge diagnosis: same  Secondary diagnoses:  Patient Active Problem List   Diagnosis Date Noted   BPH with obstruction/lower urinary tract symptoms 08/29/2022    Procedures performed: Procedure(s): TRANSURETHRAL RESECTION OF THE PROSTATE (TURP); PROSTATE BIOPSY  History and Physical: For full details, please see admission history and physical. Briefly, Jeremy Mckay is a 56 y.o. year old patient with hematospermia and BPH with BOO.   No overnight events.  NAD, alert and oriented Nonlabored repsirations Foley draining very clear urine, CBI weaned  Hospital Course: Patient tolerated the procedure well.  He was then transferred to the floor after an uneventful PACU stay.  His hospital course was uncomplicated.  On POD#1 he had met discharge criteria: was eating a regular diet, was up and ambulating independently,  pain was well controlled, was  CBI was weaned and urine was clear and was ready to for discharge.   Laboratory values:  Recent Labs    08/29/22 1056  HGB 16.7  HCT 49.0   Recent Labs    08/29/22 1056  NA 143  K 4.1  CL 105  GLUCOSE 116*  BUN 9  CREATININE 1.10   No results for input(s): "LABPT", "INR" in the last 72 hours. No results for input(s): "LABURIN" in the last 72 hours. Results for orders placed or performed in visit on 04/24/19  Novel Coronavirus, NAA ([facility])     Status: None   Collection Time: 04/24/19 12:00 AM  Result Value Ref Range Status   SARS-CoV-2, NAA Not Detected Not Detected Final    Comment: Testing was performed using the cobas(R) SARS-CoV-2 test. This test was developed and its performance characteristics determined by [employer]. This test has not been FDA cleared or approved. This test has been authorized by FDA under an Emergency Use Authorization (EUA). This test is only authorized for the duration  of time the declaration that circumstances exist justifying the authorization of the emergency use of in vitro diagnostic tests for detection of SARS-CoV-2 virus and/or diagnosis of COVID-19 infection under section 564(b)(1) of the Act, 21 U.S.C. 454UJW-1(X)(9), unless the authorization is terminated or revoked sooner. When diagnostic testing is negative, the possibility of a false negative result should be considered in the context of a patient's recent exposures and the presence of clinical signs and symptoms consistent with COVID-19. An individual without symptoms of COVID-19 and who is not shedding SARS-CoV-2 virus would expect to have a negati ve (not detected) result in this assay.     Disposition: Home  Discharge instruction: The patient was instructed to be ambulatory but told to refrain from heavy lifting, strenuous activity, or driving.   Discharge medications:  Allergies as of 08/30/2022       Reactions   Amoxicillin    Peanut-containing Drug Products Swelling   Patient states any type of nut containing products cause swelling.   Penicillins         Medication List     TAKE these medications    amLODipine 5 MG tablet Commonly known as: NORVASC Take 1 tablet (5 mg total) by mouth daily.   atorvastatin 20 MG tablet Commonly known as: LIPITOR Take 20 mg by mouth daily.   glipiZIDE 2.5 MG 24 hr tablet Commonly known as: GLUCOTROL XL Take 2.5 mg by mouth daily with breakfast.   irbesartan 300 MG tablet Commonly known as: AVAPRO Take 1 tablet (300 mg total)  by mouth daily.   metFORMIN 500 MG tablet Commonly known as: GLUCOPHAGE Take by mouth daily.   oxyCODONE 5 MG immediate release tablet Commonly known as: Oxy IR/ROXICODONE Take 1 tablet (5 mg total) by mouth every 4 (four) hours as needed for moderate pain.        Followup:   Follow-up Information     [facility] Follow up on 08/31/2022.   Why: you will be contact for an  appointment in the office on 11/30 Contact information: [place] [phone]

## 2022-08-30 NOTE — Discharge Instructions (Addendum)
Post transurethral resection of the prostate (TURP) instructions ° °Your recent prostate surgery requires very special post hospital care. Despite the fact that no skin incisions were used the area around the prostate incision is quite raw and is covered with a scab to promote healing and prevent bleeding. Certain cautions are needed to assure that the scab is not disturbed of the next 2-3 weeks while the healing proceeds. ° °Because the raw surface in your prostate and the irritating effects of urine you may expect frequency of urination and/or urgency (a stronger desire to urinate) and perhaps even getting up at night more often. This will usually resolve or improve slowly over the healing period. You may see some blood in your urine over the first 6 weeks. Do not be alarmed, even if the urine was clear for a while. Get off your feet and drink lots of fluids until clearing occurs. If you start to pass clots or don't improve call us. ° °Catheter: (If you are discharged with a catheter.) °1. Keep your catheter secured to your leg at all times with tape or the supplied strap. °2. You may experience leakage of urine around your catheter- as long as the  °catheter continues to drain, this is normal.  If your catheter stops draining  °go to the ER. °3. You may also have blood in your urine, even after it has been clear for  °several days; you may even pass some small blood clots or other material.  This  °is normal as well.  If this happens, sit down and drink plenty of water to help  °make urine to flush out your bladder.  If the blood in your urine becomes worse  °after doing this, contact our office or return to the ER. °4. You may use the leg bag (small bag) during the day, but use the large bag at  °night. ° °Diet: ° °You may return to your normal diet immediately. Because of the raw surface of your bladder, alcohol, spicy foods, foods high in acid and drinks with caffeine may cause irritation or frequency and  should be used in moderation. To keep your urine flowing freely and avoid constipation, drink plenty of fluids during the day (8-10 glasses). Tip: Avoid cranberry juice because it is very acidic. ° °Activity: ° °Your physical activity doesn't need to be restricted. However, if you are very active, you may see some blood in the urine. We suggest that you reduce your activity under the circumstances until the bleeding has stopped. ° °Bowels: ° °It is important to keep your bowels regular during the postoperative period. Straining with bowel movements can cause bleeding. A bowel movement every other day is reasonable. Use a mild laxative if needed, such as milk of magnesia 2-3 tablespoons, or 2 Dulcolax tablets. Call if you continue to have problems. If you had been taking narcotics for pain, before, during or after your surgery, you may be constipated. Take a laxative if necessary. ° °Medication: ° °You should resume your pre-surgery medications unless told not to. DO NOT RESUME YOUR ASPIRIN, WARFARIN, OR OTHER BLOOD THINNER FOR 1 WEEK. In addition you may be given an antibiotic to prevent or treat infection. Antibiotics are not always necessary. All medication should be taken as prescribed until the bottles are finished unless you are having an unusual reaction to one of the drugs. ° ° ° ° °Problems you should report to us: ° °a. Fever greater than 101°F. °b. Heavy bleeding, or clots (see   notes above about blood in urine). c. Inability to urinate. d. Drug reactions (hives, rash, nausea, vomiting, diarrhea). e. Severe burning or pain with urination that is not improving.         To Whom It May Concern:  Jeremy Mckay underwent a procedure on 08/29/22.  He has lifting restrictions to not lift anything heavier than 10 lbs for 4 weeks.  In addition, no strenuous activity for the same period of time.  He is okay to return to work on 12/4 with these limitations.  Please direct any questions to [facility] [phone].   Kasandra Knudsen, MD

## 2023-01-19 LAB — GLUCOSE, POCT (MANUAL RESULT ENTRY): Glucose Fasting, POC: 86 mg/dL (ref 70–99)

## 2023-01-19 NOTE — Progress Notes (Signed)
Pt is fasting and needs PCP/ given information about BP management and clinics. Pt giving unclear answers on SDOH questions, please follow up.

## 2023-02-05 ENCOUNTER — Encounter: Payer: Self-pay | Admitting: *Deleted

## 2023-02-05 NOTE — Progress Notes (Signed)
Pt attended 01/19/23 screening event where b/p was 152/83 and A1C was 7.1. During the follow up call, the pt stated his PCP is Dr. Everlene Other at [facility]. Chart review indicates pt had his annual physical exam on 09/14/23, a diabetes check up on 12/22/22 at Dr. Bonney Leitz office, and he has a future appt with his PCP on 10//3/24. Further chart review indicates pt's b/p was wnl at the 12/23 and 3/24 office visits. Pt stated on call that he has his screening results from the event that he can share with Dr. Everlene Other and denied any SDOH insecurities. No additional health equity team support is indicated at this time.

## 2023-03-25 IMAGING — DX DG ELBOW COMPLETE 3+V*R*
4 series · 4 of 4 positions shown · non-contrast
Comparison: None.

CLINICAL DATA: Fall with small laceration

EXAM:
RIGHT ELBOW - COMPLETE 3+ VIEW

[elbow ap]
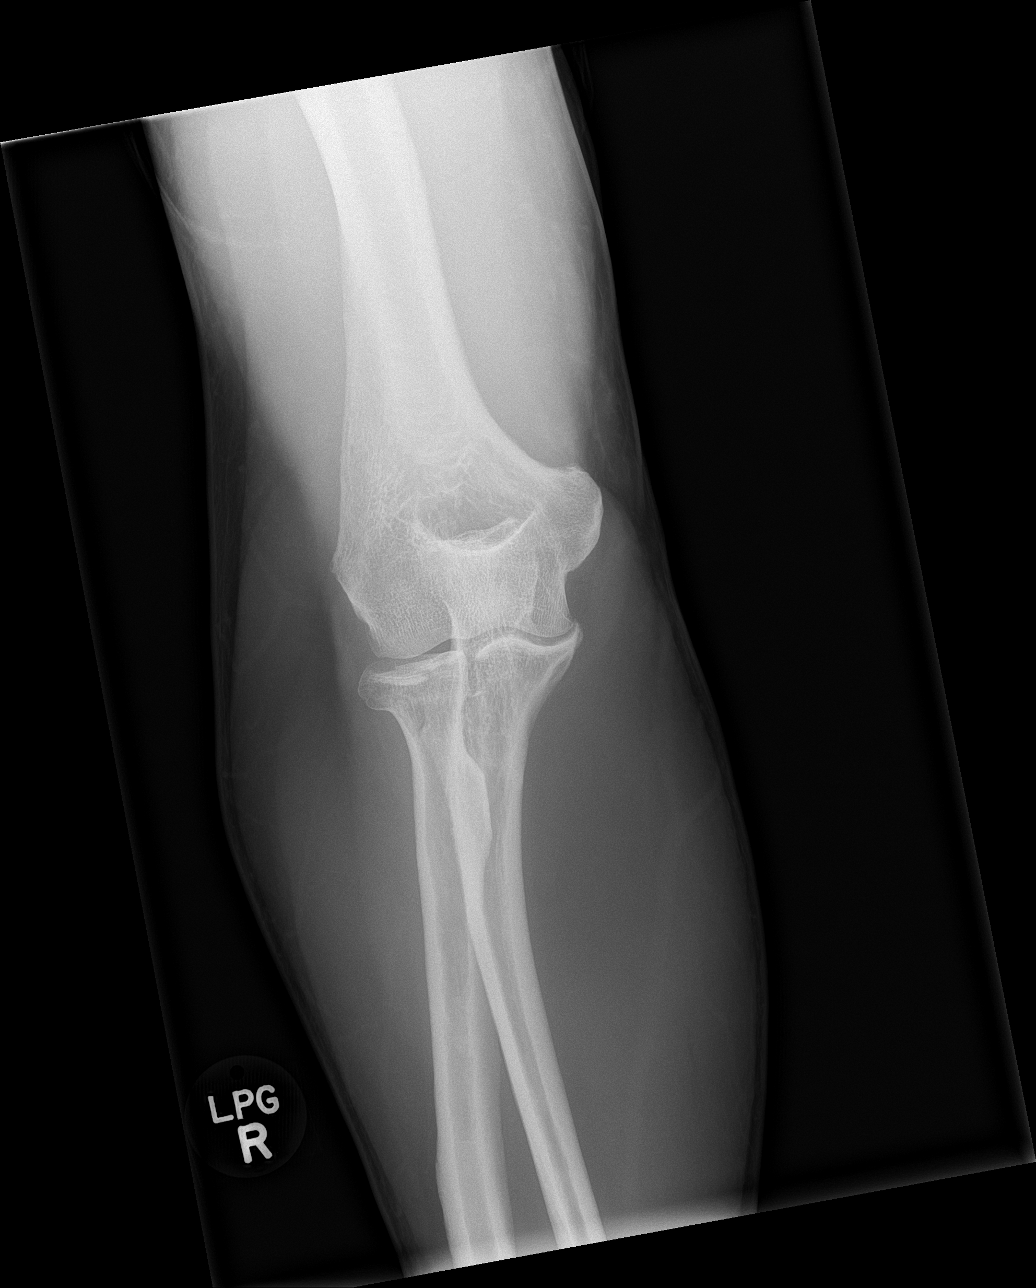

[elbow obl (1 of 2)]
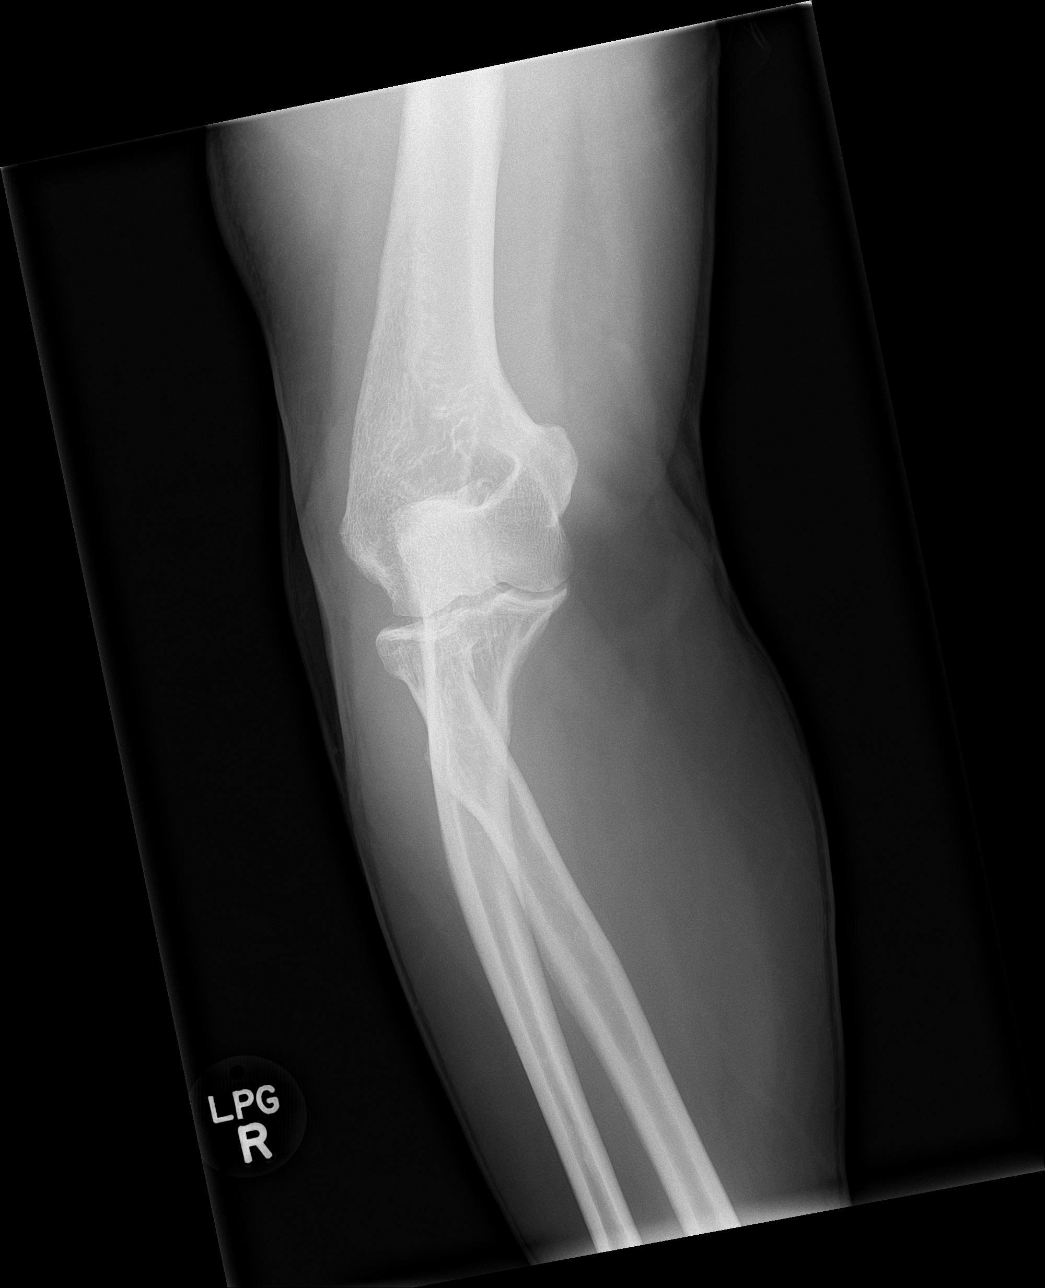

[elbow obl (2 of 2)]
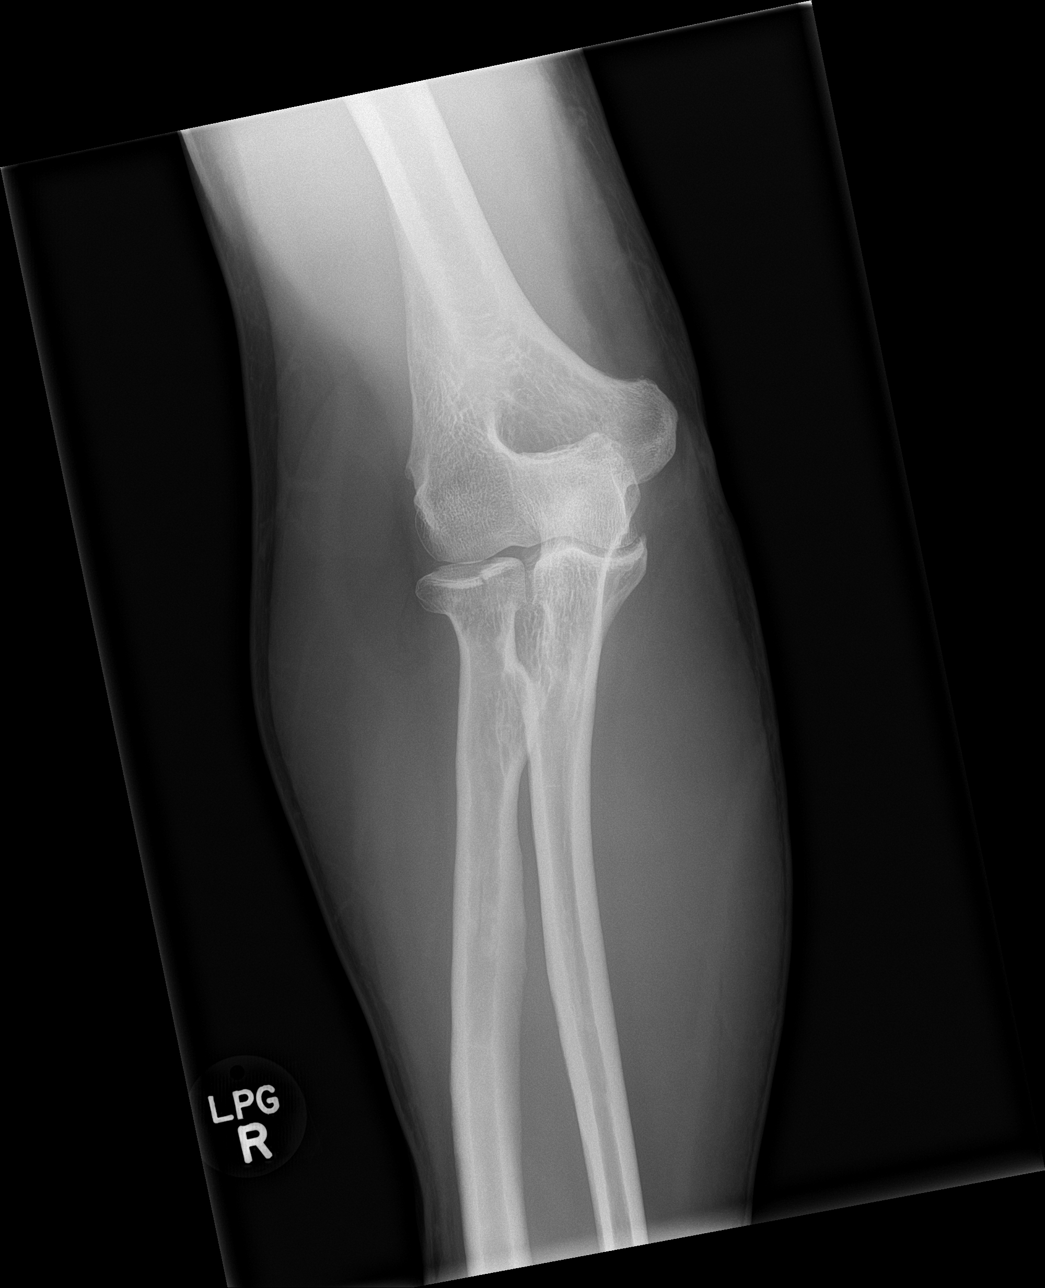

[elbow lat]
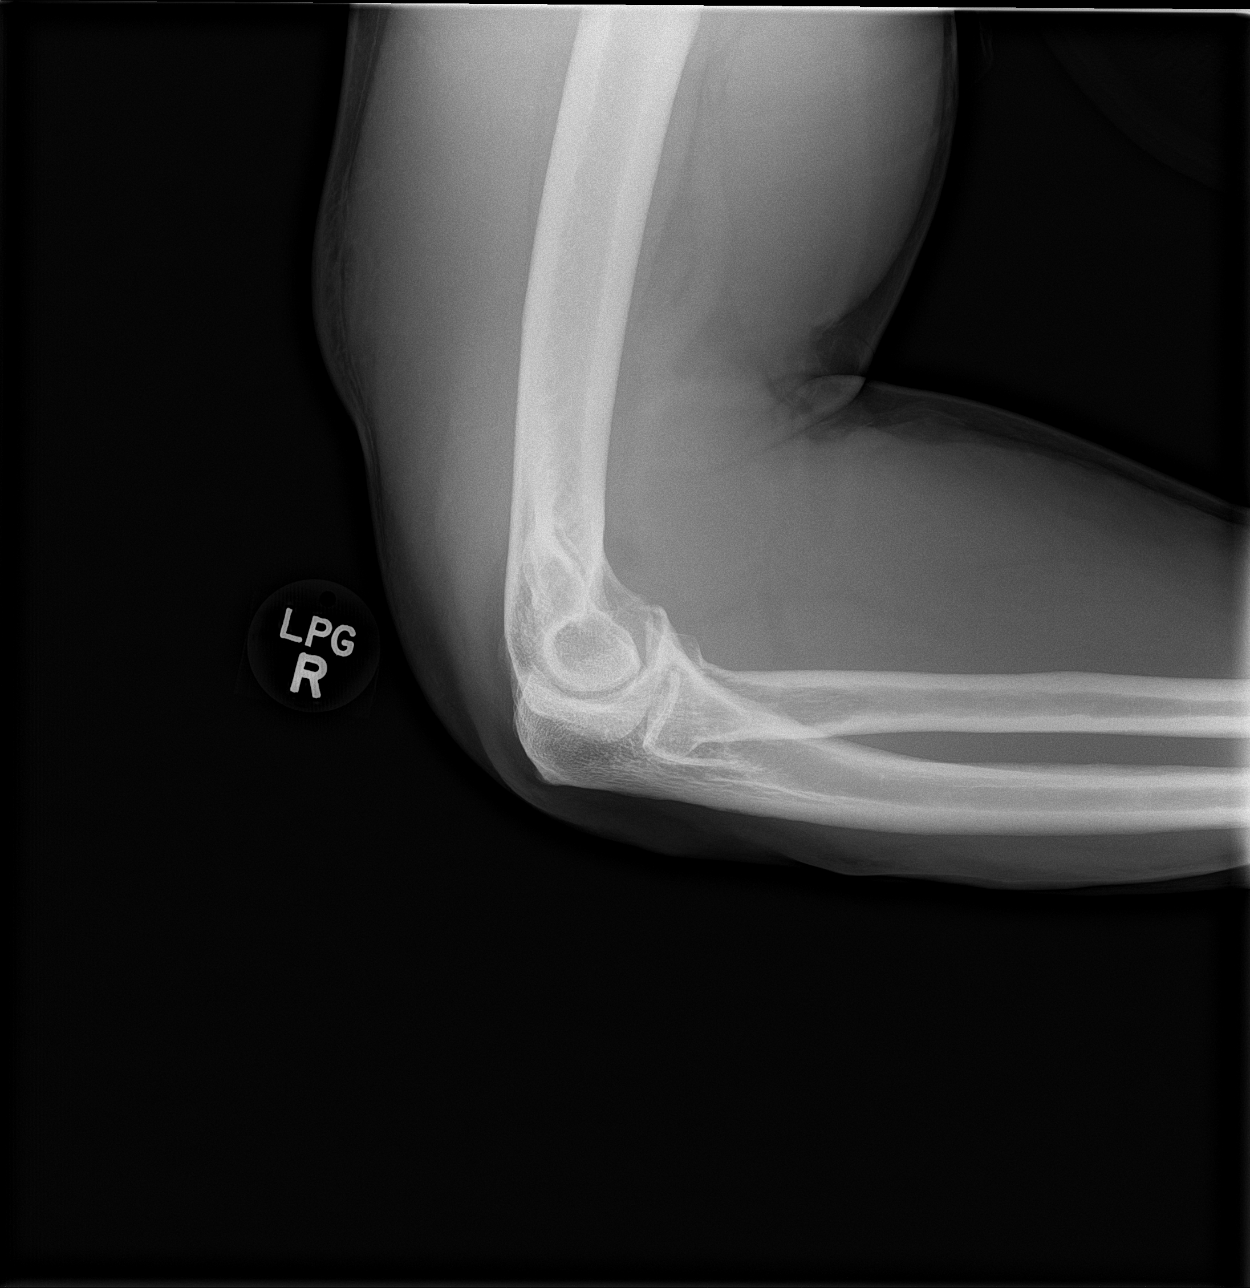

[4 of 4 positions shown; findings below may reference images not displayed]

FINDINGS: There is a nondisplaced fracture of the head of the right radius at
the articulation with the humerus. Joint effusion is present. No
radiopaque foreign body.
IMPRESSION: Nondisplaced acute fracture of the head of the right radius at the
radiocapitellar joint. Joint effusion. No radiopaque foreign body.

## 2024-07-10 ENCOUNTER — Ambulatory Visit

## 2024-07-10 ENCOUNTER — Ambulatory Visit: Admitting: Podiatry

## 2024-07-10 ENCOUNTER — Encounter: Payer: Self-pay | Admitting: Podiatry

## 2024-07-10 DIAGNOSIS — M722 Plantar fascial fibromatosis: Secondary | ICD-10-CM | POA: Diagnosis not present

## 2024-07-10 DIAGNOSIS — M778 Other enthesopathies, not elsewhere classified: Secondary | ICD-10-CM

## 2024-07-10 MED ORDER — TRIAMCINOLONE ACETONIDE 10 MG/ML IJ SUSP
10.0000 mg | Freq: Once | INTRAMUSCULAR | Status: AC
  Administered 2024-07-10: 10 mg via INTRA_ARTICULAR

## 2024-07-10 MED ORDER — DICLOFENAC SODIUM 75 MG PO TBEC: 75.0000 mg | DELAYED_RELEASE_TABLET | Freq: Two times a day (BID) | ORAL | 2 refills | Status: AC

## 2024-07-10 NOTE — Progress Notes (Signed)
 Subjective:   Patient ID: Jeremy Mckay, male   DOB: 58 y.o.   MRN: [id number]   HPI Patient presents with a lot of pain and swelling more in the right foot than the left foot and states that it started in the top of the foot then around the fifth toe and then around the inside of the foot.  States it is sore to walk on and does not remember specific other issue associated with it.  Patient does not smoke likes to be active   Review of Systems  All other systems reviewed and are negative.       Objective:  Physical Exam Vitals and nursing note reviewed.  Constitutional:      Appearance: He is well-developed.  Pulmonary:     Effort: Pulmonary effort is normal.  Musculoskeletal:        General: Normal range of motion.  Skin:    General: Skin is warm.  Neurological:     Mental Status: He is alert.     Neurovascular status intact muscle strength adequate range of motion within normal limits with patient found to have quite a bit of inflammation in the right arch and around posterior tibial tendon and quite a bit of inflammation around the 4th and 5th MPJ no drainage noted no erythema no systemic sign of infection.  Good digital perfusion well-oriented     Assessment:  Appears to be inflammatory versus infective but it also could be systemic inflammatory     Plan:  H&P reviewed and I went ahead today I also discussed diabetes which is under excellent control and I went ahead and injected the medial fascial tendon 3 mg Kenalog 5 mg Xylocaine  and then did sterile second injection of the 4th and 5th MPJ periarticular 3 mg dexamethasone  Kenalog 5 mg Xylocaine  and placed on steroid Dosepak with instructions on usage.  I want to check him back again in the next couple weeks I gave strict instructions if any other changes were to occur he is to let us  know right away but again no current pathology noted.  X-rays indicate that there is no signs of bony injury appears to be soft tissue

## 2024-07-24 ENCOUNTER — Ambulatory Visit: Admitting: Podiatry

## 2024-07-24 ENCOUNTER — Ambulatory Visit

## 2024-07-24 DIAGNOSIS — M722 Plantar fascial fibromatosis: Secondary | ICD-10-CM

## 2024-07-24 DIAGNOSIS — M778 Other enthesopathies, not elsewhere classified: Secondary | ICD-10-CM

## 2024-07-24 NOTE — Progress Notes (Unsigned)
 Subjective:  Patient ID: Jeremy Mckay, male    DOB: 06-19-1966,  MRN: [id number]  Chief Complaint  Patient presents with   Foot Pain    Rm21 2 week f/u right foot 4-5 MPJ/95 % imprroved/     58 y.o. male presents with the above complaint. He was seen 2 weeks ago for his right foot plantar fasciitis as well as 4/5 MTP pain. He states he is approximately 95% better. He states his heel is still bothering him a little bit and is interested in custom insoles.   Review of Systems: Negative except as noted in the HPI. Denies N/V/F/Ch.  Past Medical History:  Diagnosis Date   BPH (benign prostatic hyperplasia) 2023   Diabetes mellitus without complication (HCC)    Hyperlipidemia    Hypertension     Current Outpatient Medications:    amLODipine  (NORVASC ) 5 MG tablet, Take 1 tablet (5 mg total) by mouth daily., Disp: 90 tablet, Rfl: 3   atorvastatin  (LIPITOR) 20 MG tablet, Take 20 mg by mouth daily., Disp: , Rfl:    diclofenac (VOLTAREN) 75 MG EC tablet, Take 1 tablet (75 mg total) by mouth 2 (two) times daily., Disp: 50 tablet, Rfl: 2   glipiZIDE  (GLUCOTROL  XL) 2.5 MG 24 hr tablet, Take 2.5 mg by mouth daily with breakfast., Disp: , Rfl:    irbesartan  (AVAPRO ) 300 MG tablet, Take 1 tablet (300 mg total) by mouth daily., Disp: 90 tablet, Rfl: 3   metFORMIN  (GLUCOPHAGE ) 500 MG tablet, Take by mouth daily., Disp: , Rfl:    oxyCODONE  (OXY IR/ROXICODONE ) 5 MG immediate release tablet, Take 1 tablet (5 mg total) by mouth every 4 (four) hours as needed for moderate pain., Disp: 30 tablet, Rfl: 0  Social History   Tobacco Use  Smoking Status Never  Smokeless Tobacco Never    Allergies  Allergen Reactions   Amoxicillin    Peanut-Containing Drug Products Swelling    Patient states any type of nut containing products cause swelling.   Penicillins    Objective:  There were no vitals filed for this visit. There is no height or weight on file to calculate BMI. Constitutional Well  developed. Well nourished. Oriented to person, place, and time.  Vascular Dorsalis pedis pulses palpable bilaterally. Posterior tibial pulses palpable bilaterally. Capillary refill normal to all digits.  No cyanosis or clubbing noted. Pedal hair growth normal.  Neurologic Normal speech. Epicritic sensation to light touch grossly present bilaterally. Negative tinel sign at tarsal tunnel bilaterally.   Dermatologic Skin texture and turgor are within normal limits.  No open wounds. No skin lesions.  Musculoskeletal: Normal HF and 1st MTP ROM without pain or crepitus bilaterally. Rectus footshape. Minimal pain to palpation at the calcaneal tuber right. No pain with calcaneal squeeze right.     Assessment:   1. Plantar fasciitis, right   2. Capsulitis of right foot    Plan:  Patient was evaluated and treated and all questions answered.  Plantar Fasciitis, right - Patient doing much better after injection at last visit. Continuing to take diclofenac prescribed at last visit. - We discussed the importance of stretching his achilles and plantar fascia. He was dispensed stretching instructions today. - He is interested in custom orthotics. He would like to call insurance and find out the cost to him before proceeding. We discussed other OTC options. - RTC for custom orthotic fitting   Prentice Ovens, DPM AACFAS Fellowship Trained Podiatric Surgeon [facility]

## 2024-07-24 NOTE — Patient Instructions (Signed)

## 2024-08-18 ENCOUNTER — Telehealth: Payer: Self-pay

## 2024-08-18 NOTE — Telephone Encounter (Signed)
 Called patient to get appointment R/S. Requested patient call back to have appt r/s due to pedorthist not being in office that day.

## 2024-08-20 ENCOUNTER — Other Ambulatory Visit
